# Patient Record
Sex: Female | Born: 1955 | Race: Black or African American | Hispanic: No | Marital: Single | Smoking: Never smoker
Health system: Southern US, Community
[De-identification: ages and names within clinical notes are randomized; demographics above are authoritative.]

## PROBLEM LIST (undated history)

## (undated) DIAGNOSIS — E7800 Pure hypercholesterolemia, unspecified: Secondary | ICD-10-CM

## (undated) DIAGNOSIS — E118 Type 2 diabetes mellitus with unspecified complications: Secondary | ICD-10-CM

## (undated) DIAGNOSIS — I10 Essential (primary) hypertension: Secondary | ICD-10-CM

## (undated) DIAGNOSIS — K219 Gastro-esophageal reflux disease without esophagitis: Secondary | ICD-10-CM

## (undated) HISTORY — PX: CARPAL TUNNEL RELEASE: SHX101

---

## 2010-12-31 ENCOUNTER — Ambulatory Visit: Payer: Self-pay | Admitting: Internal Medicine

## 2012-10-04 LAB — HM PAP SMEAR: HM Pap smear: NEGATIVE

## 2013-01-25 ENCOUNTER — Ambulatory Visit: Payer: Self-pay | Admitting: Family Medicine

## 2013-01-25 LAB — URINALYSIS, COMPLETE
Bilirubin,UR: NEGATIVE
Glucose,UR: NEGATIVE mg/dL (ref 0–75)
Ketone: NEGATIVE
Ph: 6 (ref 4.5–8.0)
Protein: 300
Specific Gravity: >=1.03 (ref 1.003–1.030)

## 2013-01-27 LAB — URINE CULTURE

## 2013-07-01 LAB — HM MAMMOGRAPHY: HM Mammogram: NORMAL

## 2014-06-29 ENCOUNTER — Ambulatory Visit: Payer: Self-pay | Admitting: Internal Medicine

## 2014-06-29 LAB — GC/CHLAMYDIA PROBE AMP

## 2014-06-29 LAB — WET PREP, GENITAL

## 2014-11-03 LAB — LIPID PANEL
CHOLESTEROL: 201 mg/dL — AB (ref 0–200)
HDL: 41 mg/dL (ref 35–70)
LDL Cholesterol: 145 mg/dL
TRIGLYCERIDES: 77 mg/dL (ref 40–160)

## 2014-11-03 LAB — TSH: TSH: 1.02 u[IU]/mL (ref <=5.90)

## 2014-11-03 LAB — CBC AND DIFFERENTIAL: Hemoglobin: 13.1 g/dL (ref 12.0–16.0)

## 2014-11-03 LAB — BASIC METABOLIC PANEL
BUN: 11 mg/dL (ref 4–21)
CREATININE: 0.6 mg/dL (ref <=1.1)

## 2014-12-07 ENCOUNTER — Telehealth: Payer: Self-pay

## 2014-12-07 NOTE — Telephone Encounter (Signed)
-----   Message from Otilio Jefferson sent at 12/04/2014  9:22 AM EDT ----- Regarding: Referrals From: Shary Decamp Sent: 11/22/2014  Please contact pt for Colonoscopy Triage.

## 2014-12-07 NOTE — Telephone Encounter (Signed)
Pt scheduled for a colonoscopy at [facility] on 02-22-15.

## 2014-12-11 ENCOUNTER — Other Ambulatory Visit: Payer: Self-pay

## 2014-12-11 DIAGNOSIS — Z1211 Encounter for screening for malignant neoplasm of colon: Secondary | ICD-10-CM

## 2014-12-11 MED ORDER — NA SULFATE-K SULFATE-MG SULF 17.5-3.13-1.6 GM/177ML PO SOLN: 1.0000 | ORAL | Status: DC

## 2015-04-20 ENCOUNTER — Encounter: Admission: RE | Payer: Self-pay | Source: Ambulatory Visit

## 2015-04-20 ENCOUNTER — Ambulatory Visit
Admission: RE | Admit: 2015-04-20 | Payer: Managed Care, Other (non HMO) | Source: Ambulatory Visit | Admitting: Gastroenterology

## 2015-04-20 SURGERY — COLONOSCOPY
Anesthesia: Choice

## 2015-05-02 ENCOUNTER — Encounter: Payer: Self-pay | Admitting: Internal Medicine

## 2015-05-02 ENCOUNTER — Other Ambulatory Visit: Payer: Self-pay | Admitting: Internal Medicine

## 2015-05-02 DIAGNOSIS — I2510 Atherosclerotic heart disease of native coronary artery without angina pectoris: Secondary | ICD-10-CM | POA: Insufficient documentation

## 2015-05-02 DIAGNOSIS — I10 Essential (primary) hypertension: Secondary | ICD-10-CM | POA: Insufficient documentation

## 2015-05-02 DIAGNOSIS — K219 Gastro-esophageal reflux disease without esophagitis: Secondary | ICD-10-CM | POA: Insufficient documentation

## 2015-11-01 ENCOUNTER — Other Ambulatory Visit: Payer: Self-pay | Admitting: Internal Medicine

## 2015-11-12 NOTE — Telephone Encounter (Signed)
Pt scheduled CPE on 8/29 @ 930 and was added to a cancellation list.

## 2015-11-21 ENCOUNTER — Other Ambulatory Visit: Payer: Self-pay | Admitting: Internal Medicine

## 2016-01-09 ENCOUNTER — Ambulatory Visit
Admission: EM | Admit: 2016-01-09 | Discharge: 2016-01-09 | Disposition: A | Discharge status: Home / Self Care | Payer: Managed Care, Other (non HMO) | Attending: Family Medicine | Admitting: Family Medicine

## 2016-01-09 DIAGNOSIS — J0100 Acute maxillary sinusitis, unspecified: Secondary | ICD-10-CM

## 2016-01-09 DIAGNOSIS — J40 Bronchitis, not specified as acute or chronic: Secondary | ICD-10-CM

## 2016-01-09 MED ORDER — HYDROCOD POLST-CPM POLST ER 10-8 MG/5ML PO SUER: 5.0000 mL | Freq: Every evening | ORAL | Status: DC | PRN

## 2016-01-09 MED ORDER — BENZONATATE 100 MG PO CAPS: 100.0000 mg | ORAL_CAPSULE | Freq: Three times a day (TID) | ORAL | Status: DC | PRN

## 2016-01-09 MED ORDER — DOXYCYCLINE HYCLATE 100 MG PO CAPS: 100.0000 mg | ORAL_CAPSULE | Freq: Two times a day (BID) | ORAL | Status: DC

## 2016-01-09 NOTE — Discharge Instructions (Signed)
Take medication as prescribed. Rest. Drink plenty of fluids.   Follow up with your primary care physician this week as needed. Return to Urgent care for new or worsening concerns.    Sinusitis, Adult Sinusitis is redness, soreness, and puffiness (inflammation) of the air pockets in the bones of your face (sinuses). The redness, soreness, and puffiness can cause air and mucus to get trapped in your sinuses. This can allow germs to grow and cause an infection.  HOME CARE   Drink enough fluids to keep your pee (urine) clear or pale yellow.  Use a humidifier in your home.  Run a hot shower to create steam in the bathroom. Sit in the bathroom with the door closed. Breathe in the steam 3-4 times a day.  Put a warm, moist washcloth on your face 3-4 times a day, or as told by your doctor.  Use salt water sprays (saline sprays) to wet the thick fluid in your nose. This can help the sinuses drain.  Only take medicine as told by your doctor. GET HELP RIGHT AWAY IF:   Your pain gets worse.  You have very bad headaches.  You are sick to your stomach (nauseous).  You throw up (vomit).  You are very sleepy (drowsy) all the time.  Your face is puffy (swollen).  Your vision changes.  You have a stiff neck.  You have trouble breathing. MAKE SURE YOU:   Understand these instructions.  Will watch your condition.  Will get help right away if you are not doing well or get worse.   This information is not intended to replace advice given to you by your health care provider. Make sure you discuss any questions you have with your health care provider.   Document Released: 12/03/2007 Document Revised: 07/07/2014 Document Reviewed: 01/20/2012 Elsevier Interactive Patient Education [employer].

## 2016-01-09 NOTE — ED Notes (Signed)
Patient complains of cough x 10 days. Patient states that she has been having a productive cough with yellow mucus. Patient states that cough is worse at night and when laying down. Patient denies shortness of breath and wheezing.

## 2016-01-09 NOTE — ED Provider Notes (Signed)
[facility]  ____________________________________________  Time seen: Approximately 8:54 AM  I have reviewed the triage vital signs and the nursing notes.   HISTORY  Chief Complaint Cough  HPI Molly Kelly is a 60 y.o. female presents with complaint of 10 days of cough. Patient reports she does have a productive cough that is a thick yellowish whitish mucus. Patient reports cough is worse at night and states that she can feel postnasal drainage. Patient reports that she is having nasal congestion as well, but unable to clear much mucus nasally. Patient states that cough frequently wakes her up at night. Denies any chest pain or shortness of breath. Denies fevers. Reports continues to eat and drink well. Denies recent sickness. Denies recent antibiotic use.  Denies chest pain, shortness breath, abdominal pain, dysuria, neck pain, back pain, extremity pain or extremity swelling.  PCP Army Melia  No LMP recorded. Patient is postmenopausal.   History reviewed. No pertinent past medical history.  Patient Active Problem List   Diagnosis Date Noted  . Arteriosclerosis of coronary artery 05/02/2015  . Acid reflux 05/02/2015  . HLD (hyperlipidemia) 05/02/2015  . Benign hypertension 05/02/2015    Past Surgical History  Procedure Laterality Date  . No past surgeries      Current Outpatient Rx  Name  Route  Sig  Dispense  Refill  . aspirin 81 MG tablet   Oral   Take 1 tablet by mouth daily.         . enalapril (VASOTEC) 10 MG tablet      TAKE 1 TABLET BY MOUTH EVERY DAY   90 tablet   0   . ranitidine (ZANTAC) 300 MG capsule      TAKE ONE CAPSULE BY MOUTH EVERY DAY   90 capsule   1   . simvastatin (ZOCOR) 40 MG tablet      TAKE 1 TABLET BY MOUTH AT BEDTIME   90 tablet   0   . spironolactone (ALDACTONE) 25 MG tablet      TAKE 1 TABLET BY MOUTH EVERY DAY   90 tablet   0   . Na Sulfate-K Sulfate-Mg Sulf (SUPREP BOWEL PREP) SOLN   Oral   Take 1  kit by mouth as directed.   1 Bottle   0     Allergies Review of patient's allergies indicates no known allergies.  Family History  Problem Relation Age of Onset  . Hypertension Mother   . CAD Brother   . Uterine cancer Mother     Social History Social History  Substance Use Topics  . Smoking status: Never Smoker   . Smokeless tobacco: None  . Alcohol Use: 1.2 oz/week    2 Standard drinks or equivalent per week     Comment: occasional    Review of Systems Constitutional: No fever/chills Eyes: No visual changes. ENT: No sore throat. As above Cardiovascular: Denies chest pain. Respiratory: Denies shortness of breath. Gastrointestinal: No abdominal pain.  No nausea, no vomiting.  No diarrhea.  No constipation. Genitourinary: Negative for dysuria. Musculoskeletal: Negative for back pain. Skin: Negative for rash. Neurological: Negative for headaches, focal weakness or numbness.  10-point ROS otherwise negative.  ____________________________________________   PHYSICAL EXAM:  VITAL SIGNS: ED Triage Vitals  Enc Vitals Group     BP 01/09/16 0845 142/93 mmHg     Pulse Rate 01/09/16 0845 79     Resp 01/09/16 0845 17     Temp 01/09/16 0845 98 F (36.7 C)  Temp Source 01/09/16 0845 Oral     SpO2 01/09/16 0845 99 %     Weight 01/09/16 0845 230 lb (104.327 kg)     Height 01/09/16 0845 5' 4"  (1.626 m)     Head Cir --      Peak Flow --      Pain Score 01/09/16 0847 0     Pain Loc --      Pain Edu? --      Excl. in [place]? --     Constitutional: Alert and oriented. Well appearing and in no acute distress. Eyes: Conjunctivae are normal. PERRL. EOMI. Head: Atraumatic.No tenderness to palpation bilateral frontal and maxillary sinuses. No swelling. No erythema.   Ears: no erythema, normal TMs bilaterally.   Nose: nasal congestion with bilateral nasal turbinate erythema and edema.   Mouth/Throat: Mucous membranes are moist.  Oropharynx non-erythematous.No tonsillar  swelling or exudate.  Neck: No stridor.  No cervical spine tenderness to palpation. Hematological/Lymphatic/Immunilogical: No cervical lymphadenopathy. Cardiovascular: Normal rate, regular rhythm. Grossly normal heart sounds.  Good peripheral circulation. Respiratory: Normal respiratory effort.  No retractions. Lungs CTAB. No wheezes, rales or rhonchi. Good air movement. Dry intermittent cough noted. Gastrointestinal: Soft and nontender. No distention. Normal Bowel sounds. No CVA tenderness. Musculoskeletal: No lower or upper extremity tenderness nor edema.  Bilateral pedal pulses equal and easily palpated. No cervical, thoracic or lumbar tenderness to palpation.  Neurologic:  Normal speech and language. No gross focal neurologic deficits are appreciated. No gait instability. Skin:  Skin is warm, dry and intact. No rash noted. Psychiatric: Mood and affect are normal. Speech and behavior are normal.  ____________________________________________   LABS (all labs ordered are listed, but only abnormal results are displayed)  Labs Reviewed - No data to display   INITIAL IMPRESSION / ASSESSMENT AND PLAN / ED COURSE  Pertinent labs & imaging results that were available during my care of the patient were reviewed by me and considered in my medical decision making (see chart for details).  Well-appearing patient. No acute distress. Presents with a complaint to 10 days of cough and chest congestion. Lungs clear throughout. Dry intermittent cough noted in room. Nasal congestion with nasal drainage. Suspect bronchitis and sinusitis. Will treat with oral doxycycline, when necessary Tussionex at night and when necessary Tessalon Perles during the day. Work note for today and tomorrow given. Encouraged rest, fluids and PCP follow-up as needed. Discussed indication, risks and benefits of medications with patient. Discussed photosensitivity with antibiotic.  Discussed follow up with Primary care physician  this week. Discussed follow up and return parameters including no resolution or any worsening concerns. Patient verbalized understanding and agreed to plan.   ____________________________________________   FINAL CLINICAL IMPRESSION(S) / ED DIAGNOSES  Final diagnoses:  Bronchitis  Acute maxillary sinusitis, recurrence not specified     Discharge Medication List as of 01/09/2016  9:15 AM    START taking these medications   Details  benzonatate (TESSALON PERLES) 100 MG capsule Take 1 capsule (100 mg total) by mouth 3 (three) times daily as needed for cough., Starting 01/09/2016, Until Discontinued, Normal    chlorpheniramine-HYDROcodone (TUSSIONEX PENNKINETIC ER) 10-8 MG/5ML SUER Take 5 mLs by mouth at bedtime as needed. do not drive or operate machinery while taking as can cause drowsiness., Starting 01/09/2016, Until Discontinued, Print    doxycycline (VIBRAMYCIN) 100 MG capsule Take 1 capsule (100 mg total) by mouth 2 (two) times daily., Starting 01/09/2016, Until Discontinued, Normal  Note: This dictation was prepared with Dragon dictation along with smaller phrase technology. Any transcriptional errors that result from this process are unintentional.       Marylene Land, NP 01/09/16 0930  Marylene Land, NP 01/09/16 0930

## 2016-01-27 ENCOUNTER — Other Ambulatory Visit: Payer: Self-pay | Admitting: Internal Medicine

## 2016-02-26 ENCOUNTER — Encounter: Payer: Self-pay | Admitting: Internal Medicine

## 2016-02-26 ENCOUNTER — Ambulatory Visit (INDEPENDENT_AMBULATORY_CARE_PROVIDER_SITE_OTHER): Payer: Managed Care, Other (non HMO) | Admitting: Internal Medicine

## 2016-02-26 ENCOUNTER — Other Ambulatory Visit: Payer: Self-pay

## 2016-02-26 ENCOUNTER — Ambulatory Visit
Admission: RE | Admit: 2016-02-26 | Discharge: 2016-02-26 | Disposition: A | Discharge status: Home / Self Care | Payer: Managed Care, Other (non HMO) | Source: Ambulatory Visit | Attending: Internal Medicine | Admitting: Internal Medicine

## 2016-02-26 VITALS — BP 126/84 | HR 85 | Resp 16 | Ht 64.0 in | Wt 230.0 lb

## 2016-02-26 DIAGNOSIS — I10 Essential (primary) hypertension: Secondary | ICD-10-CM

## 2016-02-26 DIAGNOSIS — Z1239 Encounter for other screening for malignant neoplasm of breast: Secondary | ICD-10-CM

## 2016-02-26 DIAGNOSIS — Z1231 Encounter for screening mammogram for malignant neoplasm of breast: Secondary | ICD-10-CM | POA: Diagnosis present

## 2016-02-26 DIAGNOSIS — K219 Gastro-esophageal reflux disease without esophagitis: Secondary | ICD-10-CM

## 2016-02-26 DIAGNOSIS — Z0000 Encounter for general adult medical examination without abnormal findings: Secondary | ICD-10-CM

## 2016-02-26 DIAGNOSIS — E785 Hyperlipidemia, unspecified: Secondary | ICD-10-CM | POA: Diagnosis not present

## 2016-02-26 DIAGNOSIS — Z1159 Encounter for screening for other viral diseases: Secondary | ICD-10-CM

## 2016-02-26 DIAGNOSIS — Z124 Encounter for screening for malignant neoplasm of cervix: Secondary | ICD-10-CM

## 2016-02-26 LAB — POCT URINALYSIS DIPSTICK
BILIRUBIN UA: NEGATIVE
Blood, UA: NEGATIVE
Glucose, UA: NEGATIVE
KETONES UA: NEGATIVE
LEUKOCYTES UA: NEGATIVE
Nitrite, UA: NEGATIVE
Protein, UA: NEGATIVE
Spec Grav, UA: 1.01
pH, UA: 6

## 2016-02-26 MED ORDER — ENALAPRIL MALEATE 10 MG PO TABS: 10.0000 mg | ORAL_TABLET | Freq: Every day | ORAL | 3 refills | Status: DC

## 2016-02-26 MED ORDER — RANITIDINE HCL 300 MG PO CAPS: 300.0000 mg | ORAL_CAPSULE | Freq: Every day | ORAL | 3 refills | Status: DC

## 2016-02-26 MED ORDER — SIMVASTATIN 40 MG PO TABS: 40.0000 mg | ORAL_TABLET | Freq: Every day | ORAL | 3 refills | Status: DC

## 2016-02-26 MED ORDER — SPIRONOLACTONE 25 MG PO TABS: 25.0000 mg | ORAL_TABLET | Freq: Every day | ORAL | 3 refills | Status: DC

## 2016-02-26 NOTE — Patient Instructions (Signed)
Breast Self-Awareness Practicing breast self-awareness may pick up problems early, prevent significant medical complications, and possibly save your life. By practicing breast self-awareness, you can become familiar with how your breasts look and feel and if your breasts are changing. This allows you to notice changes early. It can also offer you some reassurance that your breast health is good. One way to learn what is normal for your breasts and whether your breasts are changing is to do a breast self-exam. If you find a lump or something that was not present in the past, it is best to contact your caregiver right away. Other findings that should be evaluated by your caregiver include nipple discharge, especially if it is bloody; skin changes or reddening; areas where the skin seems to be pulled in (retracted); or new lumps and bumps. Breast pain is seldom associated with cancer (malignancy), but should also be evaluated by a caregiver. HOW TO PERFORM A BREAST SELF-EXAM The best time to examine your breasts is 5-7 days after your menstrual period is over. During menstruation, the breasts are lumpier, and it may be more difficult to pick up changes. If you do not menstruate, have reached menopause, or had your uterus removed (hysterectomy), you should examine your breasts at regular intervals, such as monthly. If you are breastfeeding, examine your breasts after a feeding or after using a breast pump. Breast implants do not decrease the risk for lumps or tumors, so continue to perform breast self-exams as recommended. Talk to your caregiver about how to determine the difference between the implant and breast tissue. Also, talk about the amount of pressure you should use during the exam. Over time, you will become more familiar with the variations of your breasts and more comfortable with the exam. A breast self-exam requires you to remove all your clothes above the waist. 1. Look at your breasts and nipples.  Stand in front of a mirror in a room with good lighting. With your hands on your hips, push your hands firmly downward. Look for a difference in shape, contour, and size from one breast to the other (asymmetry). Asymmetry includes puckers, dips, or bumps. Also, look for skin changes, such as reddened or scaly areas on the breasts. Look for nipple changes, such as discharge, dimpling, repositioning, or redness. 2. Carefully feel your breasts. This is best done either in the shower or tub while using soapy water or when flat on your back. Place the arm (on the side of the breast you are examining) above your head. Use the pads (not the fingertips) of your three middle fingers on your opposite hand to feel your breasts. Start in the underarm area and use  inch (2 cm) overlapping circles to feel your breast. Use 3 different levels of pressure (light, medium, and firm pressure) at each circle before moving to the next circle. The light pressure is needed to feel the tissue closest to the skin. The medium pressure will help to feel breast tissue a little deeper, while the firm pressure is needed to feel the tissue close to the ribs. Continue the overlapping circles, moving downward over the breast until you feel your ribs below your breast. Then, move one finger-width towards the center of the body. Continue to use the  inch (2 cm) overlapping circles to feel your breast as you move slowly up toward the collar bone (clavicle) near the base of the neck. Continue the up and down exam using all 3 pressures until you reach the   middle of the chest. Do this with each breast, carefully feeling for lumps or changes. 3.  Keep a written record with breast changes or normal findings for each breast. By writing this information down, you do not need to depend only on memory for size, tenderness, or location. Write down where you are in your menstrual cycle, if you are still menstruating. Breast tissue can have some lumps or  thick tissue. However, see your caregiver if you find anything that concerns you.  SEEK MEDICAL CARE IF:  You see a change in shape, contour, or size of your breasts or nipples.   You see skin changes, such as reddened or scaly areas on the breasts or nipples.   You have an unusual discharge from your nipples.   You feel a new lump or unusually thick areas.    This information is not intended to replace advice given to you by your health care provider. Make sure you discuss any questions you have with your health care provider.   Document Released: 06/16/2005 Document Revised: 06/02/2012 Document Reviewed: 10/01/2011 Elsevier Interactive Patient Education 2016 Elsevier Inc.  

## 2016-02-26 NOTE — Progress Notes (Signed)
Date:  02/26/2016   Name:  Molly Kelly   DOB:  [birth year]   MRN:  [id number]   Chief Complaint: Annual Exam (PAP-need colonoscopy resch and mammo) Molly Kelly is a 60 y.o. female who presents today for her Complete Annual Exam. She feels well. She reports exercising walking some. 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 She denies breast problems - due for mammogram.  She is due for Pap smear as well.  Hypertension  This is a chronic problem. The current episode started more than 1 year ago. The problem is unchanged. The problem is resistant. Pertinent negatives include no chest pain, headaches, palpitations or shortness of breath. Past treatments include ACE inhibitors and diuretics. The current treatment provides significant improvement. There are no compliance problems.   Hyperlipidemia  This is a chronic problem. The current episode started more than 1 year ago. The problem is controlled. Pertinent negatives include no chest pain or shortness of breath. Current antihyperlipidemic treatment includes statins. The current treatment provides significant improvement of lipids.   BP Readings from Last 3 Encounters:  02/26/16 126/84  01/09/16 (!) 142/93    Review of Systems  Constitutional: Negative for chills, fatigue and fever.  HENT: Negative for congestion, hearing loss, tinnitus, trouble swallowing and voice change.   Eyes: Negative for visual disturbance.  Respiratory: Negative for cough, chest tightness, shortness of breath and wheezing.   Cardiovascular: Negative for chest pain, palpitations and leg swelling.  Gastrointestinal: Positive for constipation. Negative for abdominal pain, blood in stool, diarrhea and vomiting.  Endocrine: Negative for polydipsia and polyuria.  Genitourinary: Negative for dysuria, frequency, genital sores, vaginal bleeding and vaginal discharge.  Musculoskeletal: Positive for arthralgias. Negative for gait problem  and joint swelling.  Skin: Negative for color change and rash.  Neurological: Negative for dizziness, tremors, light-headedness and headaches.  Hematological: Negative for adenopathy. Does not bruise/bleed easily.  Psychiatric/Behavioral: Positive for sleep disturbance. Negative for dysphoric mood. The patient is not nervous/anxious.     Patient Active Problem List   Diagnosis Date Noted  . Arteriosclerosis of coronary artery 05/02/2015  . Acid reflux 05/02/2015  . HLD (hyperlipidemia) 05/02/2015  . Benign hypertension 05/02/2015    Prior to Admission medications   Medication Sig Start Date End Date Taking? Authorizing Provider  aspirin 81 MG tablet Take 1 tablet by mouth daily.   Yes Historical Provider, MD  enalapril (VASOTEC) 10 MG tablet TAKE 1 TABLET BY MOUTH EVERY DAY 01/28/16  Yes Glean Hess, MD  ranitidine (ZANTAC) 300 MG capsule TAKE ONE CAPSULE BY MOUTH EVERY DAY 11/21/15  Yes Glean Hess, MD  simvastatin (ZOCOR) 40 MG tablet TAKE 1 TABLET BY MOUTH AT BEDTIME 01/28/16  Yes Glean Hess, MD  spironolactone (ALDACTONE) 25 MG tablet TAKE 1 TABLET BY MOUTH EVERY DAY 01/28/16  Yes Glean Hess, MD    No Known Allergies  Past Surgical History:  Procedure Laterality Date  . NO PAST SURGERIES      Social History  Substance Use Topics  . Smoking status: Never Smoker  . Smokeless tobacco: Never Used  . Alcohol use 1.2 oz/week    2 Standard drinks or equivalent per week     Comment: occasional     Medication list has been reviewed and updated.   Physical Exam  Constitutional: She is oriented to person, place, and time. She appears well-developed and well-nourished. No distress.  HENT:  Head: Normocephalic and atraumatic.  Right  Ear: Tympanic membrane and ear canal normal.  Left Ear: Tympanic membrane and ear canal normal.  Nose: Right sinus exhibits no maxillary sinus tenderness. Left sinus exhibits no maxillary sinus tenderness.  Mouth/Throat: Uvula  is midline and oropharynx is clear and moist.  Eyes: Conjunctivae and EOM are normal. Right eye exhibits no discharge. Left eye exhibits no discharge. No scleral icterus.  Neck: Normal range of motion. Carotid bruit is not present. No erythema present. No thyromegaly present.  Cardiovascular: Normal rate, regular rhythm, normal heart sounds and normal pulses.   Pulmonary/Chest: Effort normal. No respiratory distress. She has no wheezes. Right breast exhibits no mass, no nipple discharge, no skin change and no tenderness. Left breast exhibits no mass, no nipple discharge, no skin change and no tenderness.  Abdominal: Soft. Bowel sounds are normal. There is no hepatosplenomegaly. There is no tenderness. There is no CVA tenderness.  Genitourinary: Vagina normal and uterus normal. There is no tenderness, lesion or injury on the right labia. There is no tenderness, lesion or injury on the left labia. Cervix exhibits no motion tenderness, no discharge and no friability. Right adnexum displays no mass, no tenderness and no fullness. Left adnexum displays no mass, no tenderness and no fullness.  Musculoskeletal: Normal range of motion.  Lymphadenopathy:    She has no cervical adenopathy.    She has no axillary adenopathy.  Neurological: She is alert and oriented to person, place, and time. She has normal reflexes. No cranial nerve deficit or sensory deficit.  Skin: Skin is warm, dry and intact. No rash noted.  Psychiatric: She has a normal mood and affect. Her speech is normal and behavior is normal. Thought content normal.  Nursing note and vitals reviewed.   BP 126/84 (BP Location: Right Arm, Patient Position: Sitting, Cuff Size: Large)   Pulse 85   Resp 16   Ht 5\' 4"  (1.626 m)   Wt 230 lb (104.3 kg)   SpO2 100%   BMI 39.48 kg/m   Assessment and Plan: 1. Annual physical exam Reschedule Colonoscopy - POCT urinalysis dipstick - Ambulatory referral to Gastroenterology  2. Benign  hypertension Continue medications - - Comprehensive metabolic panel - TSH - enalapril (VASOTEC) 10 MG tablet; Take 1 tablet (10 mg total) by mouth daily.  Dispense: 90 tablet; Refill: 3 - spironolactone (ALDACTONE) 25 MG tablet; Take 1 tablet (25 mg total) by mouth daily.  Dispense: 90 tablet; Refill: 3  3. Gastroesophageal reflux disease, esophagitis presence not specified controlled - CBC with Differential/Platelet - ranitidine (ZANTAC) 300 MG capsule; Take 1 capsule (300 mg total) by mouth daily.  Dispense: 90 capsule; Refill: 3  4. HLD (hyperlipidemia) On statin therapy - Lipid panel - simvastatin (ZOCOR) 40 MG tablet; Take 1 tablet (40 mg total) by mouth at bedtime.  Dispense: 90 tablet; Refill: 3  5. Breast cancer screening - MM DIGITAL SCREENING BILATERAL; Future  6. Encounter for screening for cervical cancer  - Pap IG and HPV (high risk) DNA detection  7. Need for hepatitis C screening test - Hepatitis C antibody   Halina Maidens, MD [facility]  02/26/2016

## 2016-02-27 ENCOUNTER — Telehealth: Payer: Self-pay

## 2016-02-27 ENCOUNTER — Other Ambulatory Visit: Payer: Self-pay

## 2016-02-27 LAB — CBC WITH DIFFERENTIAL/PLATELET
BASOS ABS: 0 10*3/uL (ref 0.0–0.2)
Basos: 1 %
EOS (ABSOLUTE): 0.1 10*3/uL (ref 0.0–0.4)
Eos: 2 %
Hematocrit: 39.5 % (ref 34.0–46.6)
Hemoglobin: 13.1 g/dL (ref 11.1–15.9)
IMMATURE GRANS (ABS): 0 10*3/uL (ref 0.0–0.1)
IMMATURE GRANULOCYTES: 0 %
LYMPHS: 51 %
Lymphocytes Absolute: 2.6 10*3/uL (ref 0.7–3.1)
MCH: 31.3 pg (ref 26.6–33.0)
MCHC: 33.2 g/dL (ref 31.5–35.7)
MCV: 95 fL (ref 79–97)
MONOCYTES: 10 %
Monocytes Absolute: 0.5 10*3/uL (ref 0.1–0.9)
NEUTROS ABS: 1.8 10*3/uL (ref 1.4–7.0)
NEUTROS PCT: 36 %
PLATELETS: 292 10*3/uL (ref 150–379)
RBC: 4.18 x10E6/uL (ref 3.77–5.28)
RDW: 13.3 % (ref 12.3–15.4)
WBC: 5.1 10*3/uL (ref 3.4–10.8)

## 2016-02-27 LAB — COMPREHENSIVE METABOLIC PANEL
ALBUMIN: 4.7 g/dL (ref 3.5–5.5)
ALT: 16 IU/L (ref 0–32)
AST: 18 IU/L (ref 0–40)
Albumin/Globulin Ratio: 1.6 (ref 1.2–2.2)
Alkaline Phosphatase: 59 IU/L (ref 39–117)
BUN / CREAT RATIO: 15 (ref 9–23)
BUN: 11 mg/dL (ref 6–24)
Bilirubin Total: 0.4 mg/dL (ref 0.0–1.2)
CO2: 25 mmol/L (ref 18–29)
CREATININE: 0.75 mg/dL (ref 0.57–1.00)
Calcium: 10.4 mg/dL — ABNORMAL HIGH (ref 8.7–10.2)
Chloride: 100 mmol/L (ref 96–106)
GFR calc non Af Amer: 88 mL/min/{1.73_m2} (ref >59)
GFR, EST AFRICAN AMERICAN: 102 mL/min/{1.73_m2} (ref >59)
GLOBULIN, TOTAL: 3 g/dL (ref 1.5–4.5)
GLUCOSE: 96 mg/dL (ref 65–99)
Potassium: 4.5 mmol/L (ref 3.5–5.2)
Sodium: 139 mmol/L (ref 134–144)
TOTAL PROTEIN: 7.7 g/dL (ref 6.0–8.5)

## 2016-02-27 LAB — LIPID PANEL
CHOL/HDL RATIO: 5.1 ratio — AB (ref 0.0–4.4)
Cholesterol, Total: 205 mg/dL — ABNORMAL HIGH (ref 100–199)
HDL: 40 mg/dL (ref >39)
LDL CALC: 149 mg/dL — AB (ref 0–99)
Triglycerides: 79 mg/dL (ref 0–149)
VLDL CHOLESTEROL CAL: 16 mg/dL (ref 5–40)

## 2016-02-27 LAB — TSH: TSH: 1.63 u[IU]/mL (ref 0.450–4.500)

## 2016-02-27 LAB — HEPATITIS C ANTIBODY

## 2016-02-27 NOTE — Telephone Encounter (Signed)
Patient needs to be scheduled for a colonoscopy.

## 2016-02-28 LAB — PAP IG AND HPV HIGH-RISK
HPV, high-risk: NEGATIVE
PAP SMEAR COMMENT: 0

## 2016-03-11 NOTE — Telephone Encounter (Signed)
Patient needs to be scheduled for a colonoscopy. Please attach the appointment to the referral once the appointment has been scheduled. Notification letter has been sent to the patient.

## 2016-04-24 ENCOUNTER — Telehealth: Payer: Self-pay

## 2016-04-24 ENCOUNTER — Other Ambulatory Visit: Payer: Self-pay

## 2016-04-24 NOTE — Telephone Encounter (Signed)
Gastroenterology Pre-Procedure Review  Request Date: 05/30/2016 Requesting Physician: Dr. Army Melia  PATIENT REVIEW QUESTIONS: The patient responded to the following health history questions as indicated:    1. Are you having any GI issues? no 2. Do you have a personal history of Polyps? no 3. Do you have a family history of Colon Cancer or Polyps? no 4. Diabetes Mellitus? no 5. Joint replacements in the past 12 months?no 6. Major health problems in the past 3 months?no 7. Any artificial heart valves, MVP, or defibrillator?no    MEDICATIONS & ALLERGIES:    Patient reports the following regarding taking any anticoagulation/antiplatelet therapy:   Plavix, Coumadin, Eliquis, Xarelto, Lovenox, Pradaxa, Brilinta, or Effient? no Aspirin? yes (Heart Health )  Patient confirms/reports the following medications:  Current Outpatient Prescriptions  Medication Sig Dispense Refill  . aspirin 81 MG tablet Take 1 tablet by mouth daily.    . enalapril (VASOTEC) 10 MG tablet Take 1 tablet (10 mg total) by mouth daily. 90 tablet 3  . ranitidine (ZANTAC) 300 MG capsule Take 1 capsule (300 mg total) by mouth daily. 90 capsule 3  . simvastatin (ZOCOR) 40 MG tablet Take 1 tablet (40 mg total) by mouth at bedtime. 90 tablet 3  . spironolactone (ALDACTONE) 25 MG tablet Take 1 tablet (25 mg total) by mouth daily. 90 tablet 3   No current facility-administered medications for this visit.     Patient confirms/reports the following allergies:  No Known Allergies  No orders of the defined types were placed in this encounter.   AUTHORIZATION INFORMATION Primary Insurance: 1D#: Group #:  Secondary Insurance: 1D#: Group #:  SCHEDULE INFORMATION: Date: 05/30/2016 Time: Location: [facility]

## 2016-04-24 NOTE — Telephone Encounter (Signed)
Screening Colonoscopy Z12.11 [facility] 05/30/2016 Dr. Demetrios Loll Pre cert is not required

## 2016-05-26 ENCOUNTER — Telehealth: Payer: Self-pay | Admitting: Gastroenterology

## 2016-05-26 NOTE — Telephone Encounter (Signed)
Spoke with pt and she will call me back to reschedule when she has her work schedule.

## 2016-05-26 NOTE — Telephone Encounter (Signed)
Patient needs to cancel colonoscopy 10-09-2022 due to a death in her family. Please call to reschedule.

## 2016-05-30 ENCOUNTER — Ambulatory Visit: Admit: 2016-05-30 | Payer: Managed Care, Other (non HMO) | Admitting: Gastroenterology

## 2016-05-30 SURGERY — COLONOSCOPY WITH PROPOFOL
Anesthesia: General

## 2016-08-27 ENCOUNTER — Ambulatory Visit: Payer: Managed Care, Other (non HMO) | Admitting: Internal Medicine

## 2016-08-28 ENCOUNTER — Ambulatory Visit (INDEPENDENT_AMBULATORY_CARE_PROVIDER_SITE_OTHER): Payer: Managed Care, Other (non HMO) | Admitting: Internal Medicine

## 2016-08-28 ENCOUNTER — Encounter: Payer: Self-pay | Admitting: Internal Medicine

## 2016-08-28 VITALS — BP 124/86 | HR 74 | Ht 65.0 in | Wt 233.0 lb

## 2016-08-28 DIAGNOSIS — K219 Gastro-esophageal reflux disease without esophagitis: Secondary | ICD-10-CM

## 2016-08-28 DIAGNOSIS — I10 Essential (primary) hypertension: Secondary | ICD-10-CM

## 2016-08-28 DIAGNOSIS — E782 Mixed hyperlipidemia: Secondary | ICD-10-CM | POA: Diagnosis not present

## 2016-08-28 NOTE — Progress Notes (Signed)
Date:  08/28/2016   Name:  Molly Kelly   DOB:  April 26, 1956   MRN:  [id number]   Chief Complaint: Hypertension Hypertension  This is a chronic problem. The current episode started more than 1 year ago. The problem is unchanged. The problem is controlled. Pertinent negatives include no chest pain, headaches, palpitations or shortness of breath. Past treatments include ACE inhibitors and diuretics. The current treatment provides significant improvement.  Hyperlipidemia  This is a chronic problem. Pertinent negatives include no chest pain or shortness of breath. Current antihyperlipidemic treatment includes statins. The current treatment provides moderate improvement of lipids.  Gastroesophageal Reflux  She complains of heartburn (occasionally). She reports no abdominal pain, no chest pain or no coughing. This is a recurrent problem. The problem occurs occasionally. The symptoms are aggravated by lying down and certain foods. Pertinent negatives include no fatigue. She has tried a histamine-2 antagonist for the symptoms. The treatment provided significant relief.  Initial BP here was high but improved after 10 minute rest. [facility] was excellent.  She did get braces yesterday and ate canned soup for dinner which may be contributing to elevated BP today. In general, she is limiting her sodium intake.  Lab Results  Component Value Date   CHOL 205 (H) 02/26/2016   HDL 40 02/26/2016   LDLCALC 149 (H) 02/26/2016   TRIG 79 02/26/2016   CHOLHDL 5.1 (H) 02/26/2016     Review of Systems  Constitutional: Negative for appetite change, fatigue, fever and unexpected weight change.  HENT: Negative for tinnitus and trouble swallowing.   Eyes: Negative for visual disturbance.  Respiratory: Negative for cough, chest tightness and shortness of breath.   Cardiovascular: Negative for chest pain, palpitations and leg swelling.  Gastrointestinal: Positive for heartburn  (occasionally). Negative for abdominal pain.  Genitourinary: Negative for dysuria and hematuria.  Musculoskeletal: Negative for arthralgias.  Neurological: Negative for tremors, numbness and headaches.  Psychiatric/Behavioral: Negative for dysphoric mood.    Patient Active Problem List   Diagnosis Date Noted  . Arteriosclerosis of coronary artery 05/02/2015  . Gastroesophageal reflux disease 05/02/2015  . HLD (hyperlipidemia) 05/02/2015  . Benign hypertension 05/02/2015    Prior to Admission medications   Medication Sig Start Date End Date Taking? Authorizing Provider  aspirin 81 MG tablet Take 1 tablet by mouth daily.    Historical Provider, MD  enalapril (VASOTEC) 10 MG tablet Take 1 tablet (10 mg total) by mouth daily. 02/26/16   Glean Hess, MD  ranitidine (ZANTAC) 300 MG capsule Take 1 capsule (300 mg total) by mouth daily. 02/26/16   Glean Hess, MD  simvastatin (ZOCOR) 40 MG tablet Take 1 tablet (40 mg total) by mouth at bedtime. 02/26/16   Glean Hess, MD  spironolactone (ALDACTONE) 25 MG tablet Take 1 tablet (25 mg total) by mouth daily. 02/26/16   Glean Hess, MD    No Known Allergies  Past Surgical History:  Procedure Laterality Date  . NO PAST SURGERIES      Social History  Substance Use Topics  . Smoking status: Never Smoker  . Smokeless tobacco: Never Used  . Alcohol use 1.2 oz/week    2 Standard drinks or equivalent per week     Comment: occasional     Medication list has been reviewed and updated.   Physical Exam  Constitutional: She is oriented to person, place, and time. She appears well-developed. No distress.  HENT:  Head: Normocephalic  and atraumatic.  Neck: Normal range of motion. No thyromegaly present.  Cardiovascular: Normal rate, regular rhythm and normal heart sounds.   Pulmonary/Chest: Effort normal. No respiratory distress.  Musculoskeletal: Normal range of motion. She exhibits no edema or tenderness.  Neurological: She  is alert and oriented to person, place, and time.  Skin: Skin is warm and dry. No rash noted.  Psychiatric: She has a normal mood and affect. Her behavior is normal. Thought content normal.  Nursing note and vitals reviewed.   BP 124/86   Pulse 74   Ht 5\' 5"  (1.651 m)   Wt 233 lb (105.7 kg)   SpO2 100%   BMI 38.77 kg/m   Assessment and Plan: 1. Benign hypertension Controlled - continue low salt diet (DASH) Monitor BP at home or work intermittently  2. Gastroesophageal reflux disease, esophagitis presence not specified Stable   3. Mixed hyperlipidemia On statin therapy   No orders of the defined types were placed in this encounter.   Halina Maidens, MD [facility]  08/28/2016

## 2016-08-28 NOTE — Patient Instructions (Signed)
DASH Eating Plan DASH stands for "Dietary Approaches to Stop Hypertension." The DASH eating plan is a healthy eating plan that has been shown to reduce high blood pressure (hypertension). It may also reduce your risk for type 2 diabetes, heart disease, and stroke. The DASH eating plan may also help with weight loss. What are tips for following this plan? General guidelines  Avoid eating more than 2,300 mg (milligrams) of salt (sodium) a day. If you have hypertension, you may need to reduce your sodium intake to 1,500 mg a day.  Limit alcohol intake to no more than 1 drink a day for nonpregnant women and 2 drinks a day for men. One drink equals 12 oz of beer, 5 oz of wine, or 1 oz of hard liquor.  Work with your health care provider to maintain a healthy body weight or to lose weight. Ask what an ideal weight is for you.  Get at least 30 minutes of exercise that causes your heart to beat faster (aerobic exercise) most days of the week. Activities may include walking, swimming, or biking.  Work with your health care provider or diet and nutrition specialist (dietitian) to adjust your eating plan to your individual calorie needs. Reading food labels  Check food labels for the amount of sodium per serving. Choose foods with less than 5 percent of the Daily Value of sodium. Generally, foods with less than 300 mg of sodium per serving fit into this eating plan.  To find whole grains, look for the word "whole" as the first word in the ingredient list. Shopping  Buy products labeled as "low-sodium" or "no salt added."  Buy fresh foods. Avoid canned foods and premade or frozen meals. Cooking  Avoid adding salt when cooking. Use salt-free seasonings or herbs instead of table salt or sea salt. Check with your health care provider or pharmacist before using salt substitutes.  Do not fry foods. Cook foods using healthy methods such as baking, boiling, grilling, and broiling instead.  Cook with  heart-healthy oils, such as olive, canola, soybean, or sunflower oil. Meal planning   Eat a balanced diet that includes: ? 5 or more servings of fruits and vegetables each day. At each meal, try to fill half of your plate with fruits and vegetables. ? Up to 6-8 servings of whole grains each day. ? Less than 6 oz of lean meat, poultry, or fish each day. A 3-oz serving of meat is about the same size as a deck of cards. One egg equals 1 oz. ? 2 servings of low-fat dairy each day. ? A serving of nuts, seeds, or beans 5 times each week. ? Heart-healthy fats. Healthy fats called Omega-3 fatty acids are found in foods such as flaxseeds and coldwater fish, like sardines, salmon, and mackerel.  Limit how much you eat of the following: ? Canned or prepackaged foods. ? Food that is high in trans fat, such as fried foods. ? Food that is high in saturated fat, such as fatty meat. ? Sweets, desserts, sugary drinks, and other foods with added sugar. ? Full-fat dairy products.  Do not salt foods before eating.  Try to eat at least 2 vegetarian meals each week.  Eat more home-cooked food and less restaurant, buffet, and fast food.  When eating at a restaurant, ask that your food be prepared with less salt or no salt, if possible. What foods are recommended? The items listed may not be a complete list. Talk with your dietitian about what   dietary choices are best for you. Grains Whole-grain or whole-wheat bread. Whole-grain or whole-wheat pasta. Brown rice. Oatmeal. Quinoa. Bulgur. Whole-grain and low-sodium cereals. Pita bread. Low-fat, low-sodium crackers. Whole-wheat flour tortillas. Vegetables Fresh or frozen vegetables (raw, steamed, roasted, or grilled). Low-sodium or reduced-sodium tomato and vegetable juice. Low-sodium or reduced-sodium tomato sauce and tomato paste. Low-sodium or reduced-sodium canned vegetables. Fruits All fresh, dried, or frozen fruit. Canned fruit in natural juice (without  added sugar). Meat and other protein foods Skinless chicken or turkey. Ground chicken or turkey. Pork with fat trimmed off. Fish and seafood. Egg whites. Dried beans, peas, or lentils. Unsalted nuts, nut butters, and seeds. Unsalted canned beans. Lean cuts of beef with fat trimmed off. Low-sodium, lean deli meat. Dairy Low-fat (1%) or fat-free (skim) milk. Fat-free, low-fat, or reduced-fat cheeses. Nonfat, low-sodium ricotta or cottage cheese. Low-fat or nonfat yogurt. Low-fat, low-sodium cheese. Fats and oils Soft margarine without trans fats. Vegetable oil. Low-fat, reduced-fat, or light mayonnaise and salad dressings (reduced-sodium). Canola, safflower, olive, soybean, and sunflower oils. Avocado. Seasoning and other foods Herbs. Spices. Seasoning mixes without salt. Unsalted popcorn and pretzels. Fat-free sweets. What foods are not recommended? The items listed may not be a complete list. Talk with your dietitian about what dietary choices are best for you. Grains Baked goods made with fat, such as croissants, muffins, or some breads. Dry pasta or rice meal packs. Vegetables Creamed or fried vegetables. Vegetables in a cheese sauce. Regular canned vegetables (not low-sodium or reduced-sodium). Regular canned tomato sauce and paste (not low-sodium or reduced-sodium). Regular tomato and vegetable juice (not low-sodium or reduced-sodium). Pickles. Olives. Fruits Canned fruit in a light or heavy syrup. Fried fruit. Fruit in cream or butter sauce. Meat and other protein foods Fatty cuts of meat. Ribs. Fried meat. Bacon. Sausage. Bologna and other processed lunch meats. Salami. Fatback. Hotdogs. Bratwurst. Salted nuts and seeds. Canned beans with added salt. Canned or smoked fish. Whole eggs or egg yolks. Chicken or turkey with skin. Dairy Whole or 2% milk, cream, and half-and-half. Whole or full-fat cream cheese. Whole-fat or sweetened yogurt. Full-fat cheese. Nondairy creamers. Whipped toppings.  Processed cheese and cheese spreads. Fats and oils Butter. Stick margarine. Lard. Shortening. Ghee. Bacon fat. Tropical oils, such as coconut, palm kernel, or palm oil. Seasoning and other foods Salted popcorn and pretzels. Onion salt, garlic salt, seasoned salt, table salt, and sea salt. Worcestershire sauce. Tartar sauce. Barbecue sauce. Teriyaki sauce. Soy sauce, including reduced-sodium. Steak sauce. Canned and packaged gravies. Fish sauce. Oyster sauce. Cocktail sauce. Horseradish that you find on the shelf. Ketchup. Mustard. Meat flavorings and tenderizers. Bouillon cubes. Hot sauce and Tabasco sauce. Premade or packaged marinades. Premade or packaged taco seasonings. Relishes. Regular salad dressings. Where to find more information:  National Heart, Lung, and Blood Institute: www.nhlbi.nih.gov  American Heart Association: www.heart.org Summary  The DASH eating plan is a healthy eating plan that has been shown to reduce high blood pressure (hypertension). It may also reduce your risk for type 2 diabetes, heart disease, and stroke.  With the DASH eating plan, you should limit salt (sodium) intake to 2,300 mg a day. If you have hypertension, you may need to reduce your sodium intake to 1,500 mg a day.  When on the DASH eating plan, aim to eat more fresh fruits and vegetables, whole grains, lean proteins, low-fat dairy, and heart-healthy fats.  Work with your health care provider or diet and nutrition specialist (dietitian) to adjust your eating plan to your individual   calorie needs. This information is not intended to replace advice given to you by your health care provider. Make sure you discuss any questions you have with your health care provider. Document Released: 06/05/2011 Document Revised: 06/09/2016 Document Reviewed: 06/09/2016 Elsevier Interactive Patient Education  2017 Elsevier Inc.  

## 2016-09-11 ENCOUNTER — Ambulatory Visit
Admission: EM | Admit: 2016-09-11 | Discharge: 2016-09-11 | Disposition: A | Discharge status: Home / Self Care | Payer: 59 | Attending: Family Medicine | Admitting: Family Medicine

## 2016-09-11 ENCOUNTER — Encounter: Payer: Self-pay | Admitting: *Deleted

## 2016-09-11 DIAGNOSIS — J0100 Acute maxillary sinusitis, unspecified: Secondary | ICD-10-CM

## 2016-09-11 DIAGNOSIS — R05 Cough: Secondary | ICD-10-CM

## 2016-09-11 DIAGNOSIS — R059 Cough, unspecified: Secondary | ICD-10-CM

## 2016-09-11 HISTORY — DX: Essential (primary) hypertension: I10

## 2016-09-11 HISTORY — DX: Gastro-esophageal reflux disease without esophagitis: K21.9

## 2016-09-11 HISTORY — DX: Pure hypercholesterolemia, unspecified: E78.00

## 2016-09-11 MED ORDER — DOXYCYCLINE HYCLATE 100 MG PO CAPS: 100.0000 mg | ORAL_CAPSULE | Freq: Two times a day (BID) | ORAL | 0 refills | Status: DC

## 2016-09-11 MED ORDER — PREDNISONE 20 MG PO TABS
40.0000 mg | ORAL_TABLET | Freq: Every day | ORAL | 0 refills | Status: DC
Start: 2016-09-11 — End: 2017-04-10

## 2016-09-11 MED ORDER — ALBUTEROL SULFATE HFA 108 (90 BASE) MCG/ACT IN AERS: 2.0000 | INHALATION_SPRAY | Freq: Four times a day (QID) | RESPIRATORY_TRACT | 0 refills | Status: DC | PRN

## 2016-09-11 MED ORDER — BENZONATATE 100 MG PO CAPS: 100.0000 mg | ORAL_CAPSULE | Freq: Three times a day (TID) | ORAL | 0 refills | Status: DC | PRN

## 2016-09-11 NOTE — ED Triage Notes (Signed)
Patient started having symptoms of cough, fever, nasal congestion, and chest congestion 1 week ago.

## 2016-09-11 NOTE — ED Provider Notes (Signed)
[facility] ____________________________________________  Time seen: Approximately 1:56 PM  I have reviewed the triage vital signs and the nursing notes.   HISTORY  Chief Complaint Nasal Congestion; Cough; and Fever   HPI Molly Kelly is a 61 y.o. female presents for complaints of one week of runny nose, nasal congestion, cough and chest congestion. Reports she has been having sinus pressure and sinus discomfort. Reports some thick greenish nasal drainage as well as post nasal drainage.  Reports unresolved with robitussin and coricidin over the counter. Denies known fevers, but reports some chills and hot feelings. Reports sick contacts at work. Denies home sick contacts. Reports has continued to remain active. Reports continues to eat and drink well. States mild sinus pain around cheeks at this time, denies other pain.   Denies chest pain, shortness of breath, abdominal pain, dysuria, extremity pain, extremity swelling or rash. Denies recent sickness. Denies recent antibiotic use.   Halina Maidens, MD: PCP    Past Medical History:  Diagnosis Date  . Acid reflux   . Hypercholesteremia   . Hypertension     Patient Active Problem List   Diagnosis Date Noted  . Arteriosclerosis of coronary artery 05/02/2015  . Gastroesophageal reflux disease 05/02/2015  . HLD (hyperlipidemia) 05/02/2015  . Benign hypertension 05/02/2015    Past Surgical History:  Procedure Laterality Date  . NO PAST SURGERIES       No current facility-administered medications for this encounter.   Current Outpatient Prescriptions:  .  aspirin 81 MG tablet, Take 1 tablet by mouth daily., Disp: , Rfl:  .  enalapril (VASOTEC) 10 MG tablet, Take 1 tablet (10 mg total) by mouth daily., Disp: 90 tablet, Rfl: 3 .  ranitidine (ZANTAC) 300 MG capsule, Take 1 capsule (300 mg total) by mouth daily., Disp: 90 capsule, Rfl: 3 .  simvastatin (ZOCOR) 40 MG tablet, Take 1 tablet (40 mg total) by mouth  at bedtime., Disp: 90 tablet, Rfl: 3 .  spironolactone (ALDACTONE) 25 MG tablet, Take 1 tablet (25 mg total) by mouth daily., Disp: 90 tablet, Rfl: 3 .  albuterol (PROVENTIL HFA;VENTOLIN HFA) 108 (90 Base) MCG/ACT inhaler, Inhale 2 puffs into the lungs every 6 (six) hours as needed for wheezing., Disp: 1 Inhaler, Rfl: 0 .  benzonatate (TESSALON PERLES) 100 MG capsule, Take 1 capsule (100 mg total) by mouth 3 (three) times daily as needed for cough., Disp: 15 capsule, Rfl: 0 .  doxycycline (VIBRAMYCIN) 100 MG capsule, Take 1 capsule (100 mg total) by mouth 2 (two) times daily., Disp: 20 capsule, Rfl: 0 .  predniSONE (DELTASONE) 20 MG tablet, Take 2 tablets (40 mg total) by mouth daily., Disp: 10 tablet, Rfl: 0  Allergies Patient has no known allergies.  Family History  Problem Relation Age of Onset  . Hypertension Mother   . Uterine cancer Mother   . CAD Brother     Social History Social History  Substance Use Topics  . Smoking status: Never Smoker  . Smokeless tobacco: Never Used  . Alcohol use 1.2 oz/week    2 Standard drinks or equivalent per week     Comment: occasional    Review of Systems Constitutional: As above. Eyes: No visual changes. ENT: No sore throat. Cardiovascular: Denies chest pain. Respiratory: Denies shortness of breath. Gastrointestinal: No abdominal pain.  No nausea, no vomiting.  No diarrhea.  No constipation. Genitourinary: Negative for dysuria. Musculoskeletal: Negative for back pain. Skin: Negative for rash. Neurological: Negative for headaches, focal weakness or  numbness.  10-point ROS otherwise negative.  ____________________________________________   PHYSICAL EXAM:  VITAL SIGNS: ED Triage Vitals  Enc Vitals Group     BP 09/11/16 1305 (!) 144/83     Pulse Rate 09/11/16 1305 79     Resp 09/11/16 1305 18     Temp 09/11/16 1305 98.9 F (37.2 C)     Temp Source 09/11/16 1305 Oral     SpO2 09/11/16 1305 99 %     Weight --      Height --        Head Circumference --      Peak Flow --      Pain Score 09/11/16 1310 0     Pain Loc --      Pain Edu? --      Excl. in [place]? --    Constitutional: Alert and oriented. Well appearing and in no acute distress. Eyes: Conjunctivae are normal. PERRL. EOMI. Head: Atraumatic.Mild to moderate tenderness to palpation bilateral maxillary sinusitis. No frontal sinus tenderness to palpation. No swelling. No erythema.   Ears: no erythema, normal TMs bilaterally.   Nose: nasal congestion with bilateral nasal turbinate erythema and edema.   Mouth/Throat: Mucous membranes are moist.  Oropharynx non-erythematous. No tonsillar swelling or exudate.  Neck: No stridor.  No cervical spine tenderness to palpation. Hematological/Lymphatic/Immunilogical: No cervical lymphadenopathy. Cardiovascular: Normal rate, regular rhythm. Grossly normal heart sounds.  Good peripheral circulation. Respiratory: Normal respiratory effort.  No retractions. No wheezes, rales or rhonchi. Good air movement. Dry intermittent cough noted in room with mild bronchospasm. Speaks in complete sentences. Gastrointestinal: Soft and nontender.  Musculoskeletal: No lower extremity tenderness nor edema.  No cervical, thoracic or lumbar tenderness to palpation.  Neurologic:  Normal speech and language. No gait instability. Skin:  Skin is warm, dry and intact. No rash noted. Psychiatric: Mood and affect are normal. Speech and behavior are normal.  ___________________________________________   LABS (all labs ordered are listed, but only abnormal results are displayed)  Labs Reviewed - No data to display  PROCEDURES Procedures   INITIAL IMPRESSION / ASSESSMENT AND PLAN / ED COURSE  Pertinent labs & imaging results that were available during my care of the patient were reviewed by me and considered in my medical decision making (see chart for details).  Well-appearing patient. No acute distress. Suspect sinusitis and postnasal  drainage cough. Patient does have a mild bronchospasm with cough, will treat patient with prednisone as well as albuterol inhaler as needed. Also treat with oral doxycycline and when necessary Tessalon Perles. Encouraged rest, fluids and supportive care. Encourage PCP follow up.Discussed indication, risks and benefits of medications with patient.  Discussed follow up with Primary care physician this week. Discussed follow up and return parameters including no resolution or any worsening concerns. Patient verbalized understanding and agreed to plan.   ____________________________________________   FINAL CLINICAL IMPRESSION(S) / ED DIAGNOSES  Final diagnoses:  Acute maxillary sinusitis, recurrence not specified  Cough     Discharge Medication List as of 09/11/2016  2:06 PM    START taking these medications   Details  albuterol (PROVENTIL HFA;VENTOLIN HFA) 108 (90 Base) MCG/ACT inhaler Inhale 2 puffs into the lungs every 6 (six) hours as needed for wheezing., Starting Thu 09/11/2016, Normal    benzonatate (TESSALON PERLES) 100 MG capsule Take 1 capsule (100 mg total) by mouth 3 (three) times daily as needed for cough., Starting Thu 09/11/2016, Normal    doxycycline (VIBRAMYCIN) 100 MG capsule Take 1 capsule (100  mg total) by mouth 2 (two) times daily., Starting Thu 09/11/2016, Normal    predniSONE (DELTASONE) 20 MG tablet Take 2 tablets (40 mg total) by mouth daily., Starting Thu 09/11/2016, Normal        Note: This dictation was prepared with Dragon dictation along with smaller phrase technology. Any transcriptional errors that result from this process are unintentional.         Marylene Land, NP 09/11/16 1846

## 2016-09-11 NOTE — Discharge Instructions (Signed)
Take medication as prescribed. Rest. Drink plenty of fluids.  ° °Follow up with your primary care physician this week as needed. Return to Urgent care for new or worsening concerns.  ° °

## 2017-02-26 ENCOUNTER — Encounter: Payer: Managed Care, Other (non HMO) | Admitting: Internal Medicine

## 2017-04-10 ENCOUNTER — Ambulatory Visit (INDEPENDENT_AMBULATORY_CARE_PROVIDER_SITE_OTHER): Payer: 59 | Admitting: Internal Medicine

## 2017-04-10 ENCOUNTER — Encounter: Payer: Self-pay | Admitting: Internal Medicine

## 2017-04-10 VITALS — BP 128/82 | HR 74 | Ht 65.0 in | Wt 235.0 lb

## 2017-04-10 DIAGNOSIS — K219 Gastro-esophageal reflux disease without esophagitis: Secondary | ICD-10-CM

## 2017-04-10 DIAGNOSIS — Z1211 Encounter for screening for malignant neoplasm of colon: Secondary | ICD-10-CM | POA: Diagnosis not present

## 2017-04-10 DIAGNOSIS — E782 Mixed hyperlipidemia: Secondary | ICD-10-CM

## 2017-04-10 DIAGNOSIS — G5602 Carpal tunnel syndrome, left upper limb: Secondary | ICD-10-CM | POA: Diagnosis not present

## 2017-04-10 DIAGNOSIS — Z0000 Encounter for general adult medical examination without abnormal findings: Secondary | ICD-10-CM

## 2017-04-10 DIAGNOSIS — I10 Essential (primary) hypertension: Secondary | ICD-10-CM | POA: Diagnosis not present

## 2017-04-10 DIAGNOSIS — Z1239 Encounter for other screening for malignant neoplasm of breast: Secondary | ICD-10-CM

## 2017-04-10 LAB — POCT URINALYSIS DIPSTICK
Bilirubin, UA: NEGATIVE
GLUCOSE UA: NEGATIVE
Ketones, UA: NEGATIVE
Leukocytes, UA: NEGATIVE
Nitrite, UA: NEGATIVE
Protein, UA: NEGATIVE
RBC UA: NEGATIVE
SPEC GRAV UA: 1.015 (ref 1.010–1.025)
UROBILINOGEN UA: 0.2 U/dL
pH, UA: 6 (ref 5.0–8.0)

## 2017-04-10 NOTE — Progress Notes (Signed)
Date:  04/10/2017   Name:  Molly Kelly   DOB:  May 18, 1956   MRN:  [id number]   Chief Complaint: Annual Exam (Breast Exam. ) Molly Kelly is a 61 y.o. female who presents today for her Complete Annual Exam. She feels well. She reports exercising walking some. She reports she is sleeping fairly well. Her grandson has been hospitalized for a long time with an immune disorder- now doing better back home. She denies any breast problems. She is due for a mammogram. She did not get her colonoscopy scheduled last year-she rescheduled it on several occasions but had repeated conflicts.  Hypertension  This is a chronic problem. The problem is unchanged. The problem is controlled. Pertinent negatives include no chest pain, headaches, palpitations or shortness of breath. Past treatments include ACE inhibitors. The current treatment provides significant improvement.  Hyperlipidemia  This is a chronic problem. Pertinent negatives include no chest pain or shortness of breath. Current antihyperlipidemic treatment includes statins.  Gastroesophageal Reflux  She complains of heartburn. She reports no abdominal pain, no chest pain, no coughing or no wheezing. Pertinent negatives include no fatigue. She has tried a histamine-2 antagonist for the symptoms.  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  Now having similar sx on left hand. She would like to be referred.  She does not remember who she saw in the past.    Review of Systems  Constitutional: Negative for chills, fatigue and fever.  HENT: Negative for congestion, hearing loss, tinnitus, trouble swallowing and voice change.   Eyes: Negative for visual disturbance.  Respiratory: Negative for cough, chest tightness, shortness of breath and wheezing.   Cardiovascular: Negative for chest pain, palpitations and leg swelling.  Gastrointestinal: Positive for heartburn. Negative for abdominal pain, constipation, diarrhea and vomiting.  Endocrine:  Negative for polydipsia and polyuria.  Genitourinary: Negative for dysuria, frequency, genital sores, vaginal bleeding and vaginal discharge.  Musculoskeletal: Negative for arthralgias, gait problem and joint swelling.  Skin: Negative for color change and rash.  Neurological: Positive for numbness (in left hand at time). Negative for dizziness, tremors, light-headedness and headaches.  Hematological: Negative for adenopathy. Does not bruise/bleed easily.  Psychiatric/Behavioral: Negative for dysphoric mood and sleep disturbance. The patient is not nervous/anxious.     Patient Active Problem List   Diagnosis Date Noted  . Arteriosclerosis of coronary artery 05/02/2015  . Gastroesophageal reflux disease 05/02/2015  . HLD (hyperlipidemia) 05/02/2015  . Benign hypertension 05/02/2015    Prior to Admission medications   Medication Sig Start Date End Date Taking? Authorizing Provider  aspirin 81 MG tablet Take 1 tablet by mouth daily.   Yes [provider]  enalapril (VASOTEC) 10 MG tablet Take 1 tablet (10 mg total) by mouth daily. 02/26/16  Yes Glean Hess, MD  ranitidine (ZANTAC) 300 MG capsule Take 1 capsule (300 mg total) by mouth daily. 02/26/16  Yes Glean Hess, MD  simvastatin (ZOCOR) 40 MG tablet Take 1 tablet (40 mg total) by mouth at bedtime. 02/26/16  Yes Glean Hess, MD  spironolactone (ALDACTONE) 25 MG tablet Take 1 tablet (25 mg total) by mouth daily. 02/26/16  Yes Glean Hess, MD    No Known Allergies  Past Surgical History:  Procedure Laterality Date  . NO PAST SURGERIES      Social History  Substance Use Topics  . Smoking status: Never Smoker  . Smokeless tobacco: Never Used  . Alcohol use 1.2 oz/week  2 Standard drinks or equivalent per week     Comment: occasional     Medication list has been reviewed and updated.  PHQ 2/9 Scores 04/10/2017 02/26/2016  PHQ - 2 Score 0 0    Physical Exam  Constitutional: She is oriented to  person, place, and time. She appears well-developed and well-nourished. No distress.  HENT:  Head: Normocephalic and atraumatic.  Right Ear: Tympanic membrane and ear canal normal.  Left Ear: Tympanic membrane and ear canal normal.  Nose: Right sinus exhibits no maxillary sinus tenderness. Left sinus exhibits no maxillary sinus tenderness.  Mouth/Throat: Uvula is midline and oropharynx is clear and moist.  Eyes: Conjunctivae and EOM are normal. Right eye exhibits no discharge. Left eye exhibits no discharge. No scleral icterus.  Neck: Normal range of motion. Carotid bruit is not present. No erythema present. No thyromegaly present.  Cardiovascular: Normal rate, regular rhythm, normal heart sounds and normal pulses.   Pulmonary/Chest: Effort normal. No respiratory distress. She has no wheezes. Right breast exhibits no mass, no nipple discharge, no skin change and no tenderness. Left breast exhibits no mass, no nipple discharge, no skin change and no tenderness.  Abdominal: Soft. Bowel sounds are normal. There is no hepatosplenomegaly. There is no tenderness. There is no CVA tenderness.  Musculoskeletal: Normal range of motion.  + Tinel's and Phalen's on left  Lymphadenopathy:    She has no cervical adenopathy.    She has no axillary adenopathy.  Neurological: She is alert and oriented to person, place, and time. She has normal strength and normal reflexes. No cranial nerve deficit or sensory deficit.  Skin: Skin is warm, dry and intact. No rash noted.  Psychiatric: She has a normal mood and affect. Her speech is normal and behavior is normal. Thought content normal.  Nursing note and vitals reviewed.   BP 128/82   Pulse 74   Ht 5\' 5"  (1.651 m)   Wt 235 lb (106.6 kg)   SpO2 100%   BMI 39.11 kg/m   Assessment and Plan: 1. Annual physical exam Normal exam except for weight - encouraged resumption of healthy diet and begin regular exercise - POCT Urinalysis Dipstick  2. Breast cancer  screening - MM DIGITAL SCREENING BILATERAL; Future  3. Benign hypertension controlled - Comprehensive metabolic panel - TSH  4. Mixed hyperlipidemia On statin therapy - Lipid panel  5. Gastroesophageal reflux disease, esophagitis presence not specified Controlled on H2 blocker - CBC with Differential/Platelet  6. Carpal tunnel syndrome of left wrist - Ambulatory referral to Orthopedic Surgery  7. Colon cancer screening Has not been able to schedule colonoscopy - will order cologuard - Cologuard   No orders of the defined types were placed in this encounter.   Partially dictated using Editor, commissioning. Any errors are unintentional.  Halina Maidens, MD [facility]  04/10/2017

## 2017-04-10 NOTE — Patient Instructions (Signed)
Call insurance to see if Cologuard is covered.  If it is NOT - throw away the kit.  If it IS covered, then go ahead and send in a specimen.

## 2017-04-12 LAB — TSH: TSH: 0.914 u[IU]/mL (ref 0.450–4.500)

## 2017-04-12 LAB — CBC WITH DIFFERENTIAL/PLATELET
BASOS ABS: 0 10*3/uL (ref 0.0–0.2)
Basos: 0 %
EOS (ABSOLUTE): 0.1 10*3/uL (ref 0.0–0.4)
Eos: 2 %
HEMATOCRIT: 37.9 % (ref 34.0–46.6)
HEMOGLOBIN: 12.4 g/dL (ref 11.1–15.9)
IMMATURE GRANS (ABS): 0 10*3/uL (ref 0.0–0.1)
Immature Granulocytes: 0 %
LYMPHS ABS: 2.3 10*3/uL (ref 0.7–3.1)
LYMPHS: 48 %
MCH: 30.5 pg (ref 26.6–33.0)
MCHC: 32.7 g/dL (ref 31.5–35.7)
MCV: 93 fL (ref 79–97)
MONOCYTES: 7 %
Monocytes Absolute: 0.3 10*3/uL (ref 0.1–0.9)
NEUTROS ABS: 2 10*3/uL (ref 1.4–7.0)
Neutrophils: 43 %
PLATELETS: 273 10*3/uL (ref 150–379)
RBC: 4.06 x10E6/uL (ref 3.77–5.28)
RDW: 13.5 % (ref 12.3–15.4)
WBC: 4.7 10*3/uL (ref 3.4–10.8)

## 2017-04-12 LAB — COMPREHENSIVE METABOLIC PANEL
A/G RATIO: 2 (ref 1.2–2.2)
ALT: 15 IU/L (ref 0–32)
AST: 15 IU/L (ref 0–40)
Albumin: 4.5 g/dL (ref 3.5–5.5)
Alkaline Phosphatase: 57 IU/L (ref 39–117)
BILIRUBIN TOTAL: 0.4 mg/dL (ref 0.0–1.2)
BUN/Creatinine Ratio: 11 (ref 9–23)
BUN: 7 mg/dL (ref 6–24)
CALCIUM: 10.2 mg/dL (ref 8.7–10.2)
CHLORIDE: 106 mmol/L (ref 96–106)
CO2: 23 mmol/L (ref 20–29)
Creatinine, Ser: 0.63 mg/dL (ref 0.57–1.00)
GFR calc non Af Amer: 98 mL/min/{1.73_m2} (ref >59)
GFR, EST AFRICAN AMERICAN: 114 mL/min/{1.73_m2} (ref >59)
GLUCOSE: 93 mg/dL (ref 65–99)
Globulin, Total: 2.3 g/dL (ref 1.5–4.5)
POTASSIUM: 4.1 mmol/L (ref 3.5–5.2)
Sodium: 143 mmol/L (ref 134–144)
TOTAL PROTEIN: 6.8 g/dL (ref 6.0–8.5)

## 2017-04-12 LAB — LIPID PANEL
CHOL/HDL RATIO: 4.9 ratio — AB (ref 0.0–4.4)
Cholesterol, Total: 190 mg/dL (ref 100–199)
HDL: 39 mg/dL — ABNORMAL LOW (ref >39)
LDL CALC: 130 mg/dL — AB (ref 0–99)
TRIGLYCERIDES: 104 mg/dL (ref 0–149)
VLDL Cholesterol Cal: 21 mg/dL (ref 5–40)

## 2017-04-30 ENCOUNTER — Other Ambulatory Visit: Payer: Self-pay | Admitting: Internal Medicine

## 2017-04-30 DIAGNOSIS — E785 Hyperlipidemia, unspecified: Secondary | ICD-10-CM

## 2017-05-10 ENCOUNTER — Other Ambulatory Visit: Payer: Self-pay | Admitting: Internal Medicine

## 2017-05-10 DIAGNOSIS — K219 Gastro-esophageal reflux disease without esophagitis: Secondary | ICD-10-CM

## 2017-05-14 ENCOUNTER — Other Ambulatory Visit: Payer: Self-pay | Admitting: Internal Medicine

## 2017-05-14 DIAGNOSIS — I10 Essential (primary) hypertension: Secondary | ICD-10-CM

## 2017-05-25 ENCOUNTER — Other Ambulatory Visit: Payer: Self-pay

## 2017-05-25 ENCOUNTER — Encounter: Payer: Self-pay | Admitting: Emergency Medicine

## 2017-05-25 ENCOUNTER — Ambulatory Visit
Admission: EM | Admit: 2017-05-25 | Discharge: 2017-05-25 | Disposition: A | Discharge status: Home / Self Care | Payer: 59 | Attending: Family Medicine | Admitting: Family Medicine

## 2017-05-25 DIAGNOSIS — J209 Acute bronchitis, unspecified: Secondary | ICD-10-CM | POA: Diagnosis not present

## 2017-05-25 DIAGNOSIS — R05 Cough: Secondary | ICD-10-CM | POA: Diagnosis not present

## 2017-05-25 MED ORDER — PREDNISONE 50 MG PO TABS: ORAL_TABLET | ORAL | 0 refills | Status: DC

## 2017-05-25 MED ORDER — HYDROCOD POLST-CPM POLST ER 10-8 MG/5ML PO SUER: 5.0000 mL | Freq: Two times a day (BID) | ORAL | 0 refills | Status: DC | PRN

## 2017-05-25 NOTE — ED Triage Notes (Signed)
Patient in today c/o 1 week history of chest congestion and cough. Patient denies fever. Patient has been using Robitusin without relief.

## 2017-05-25 NOTE — ED Provider Notes (Signed)
[facility]    CSN: [id number] Arrival date & time: 05/25/17  1201  History   Chief Complaint Chief Complaint  Patient presents with  . chest congestion    cough   HPI  61 year old female presents with the above complaint.  Patient reports a one-week history of cough and congestion.  Cough is mildly productive.  She states that cough has been worsening over the weekend.  No fever.  No shortness of breath.  She has been taking Robitussin without improvement.  No known exacerbating relieving factors.  No other associated symptoms.  No other complaints at this time.   Past Medical History:  Diagnosis Date  . Acid reflux   . Hypercholesteremia   . Hypertension    Patient Active Problem List   Diagnosis Date Noted  . Arteriosclerosis of coronary artery 05/02/2015  . Gastroesophageal reflux disease 05/02/2015  . HLD (hyperlipidemia) 05/02/2015  . Benign hypertension 05/02/2015   Past Surgical History:  Procedure Laterality Date  . CARPAL TUNNEL RELEASE Right   . NO PAST SURGERIES     OB History    No data available     Home Medications    Prior to Admission medications   Medication Sig Start Date End Date Taking? Authorizing Provider  aspirin 81 MG tablet Take 1 tablet by mouth daily.   Yes [provider]  enalapril (VASOTEC) 10 MG tablet Take 1 tablet (10 mg total) by mouth daily. 02/26/16  Yes Glean Hess, MD  ranitidine (ZANTAC) 300 MG tablet TAKE 1 TABLET (300 MG TOTAL) BY MOUTH DAILY. 05/10/17  Yes Glean Hess, MD  simvastatin (ZOCOR) 40 MG tablet TAKE 1 TABLET (40 MG TOTAL) BY MOUTH AT BEDTIME. 04/30/17  Yes Glean Hess, MD  spironolactone (ALDACTONE) 25 MG tablet TAKE 1 TABLET (25 MG TOTAL) BY MOUTH DAILY. 05/14/17  Yes Glean Hess, MD  chlorpheniramine-HYDROcodone [facility] PENNKINETIC ER) 10-8 MG/5ML SUER Take 5 mLs by mouth every 12 (twelve) hours as needed. 05/25/17   Coral Spikes, DO  predniSONE (DELTASONE) 50 MG  tablet 1 tablet daily x 5 days. 05/25/17   Coral Spikes, DO   Family History Family History  Problem Relation Age of Onset  . Hypertension Mother   . Uterine cancer Mother   . CAD Brother    Social History Social History   Tobacco Use  . Smoking status: Never Smoker  . Smokeless tobacco: Never Used  Substance Use Topics  . Alcohol use: Yes    Comment: socially  . Drug use: No   Allergies   Patient has no known allergies.  Review of Systems Review of Systems  Constitutional: Negative.   Respiratory: Positive for cough. Negative for shortness of breath.    Physical Exam Triage Vital Signs ED Triage Vitals [05/25/17 1217]  Enc Vitals Group     BP 136/81     Pulse Rate 84     Resp 16     Temp 98.2 F (36.8 C)     Temp Source Oral     SpO2 100 %     Weight 230 lb (104.3 kg)     Height 5\' 5"  (1.651 m)     Head Circumference      Peak Flow      Pain Score 0     Pain Loc      Pain Edu?      Excl. in [place]?    No data found.  Updated Vital Signs BP  136/81 (BP Location: Left Arm)   Pulse 84   Temp 98.2 F (36.8 C) (Oral)   Resp 16   Ht 5\' 5"  (1.651 m)   Wt 230 lb (104.3 kg)   SpO2 100%   BMI 38.27 kg/m   Physical Exam  Constitutional: She is oriented to person, place, and time. She appears well-developed. No distress.  HENT:  Head: Normocephalic and atraumatic.  Mouth/Throat: Oropharynx is clear and moist.  Eyes: Conjunctivae are normal. Right eye exhibits no discharge. Left eye exhibits no discharge.  Cardiovascular: Normal rate and regular rhythm.  No murmur heard. Pulmonary/Chest: Effort normal and breath sounds normal. She has no wheezes. She has no rales.  Neurological: She is alert and oriented to person, place, and time.  Skin: Skin is warm. No rash noted.  Psychiatric: She has a normal mood and affect. Her behavior is normal.  Vitals reviewed.  UC Treatments / Results  Labs (all labs ordered are listed, but only abnormal results are  displayed) Labs Reviewed - No data to display  EKG  EKG Interpretation None       Radiology No results found.  Procedures Procedures (including critical care time)  Medications Ordered in UC Medications - No data to display   Initial Impression / Assessment and Plan / UC Course  I have reviewed the triage vital signs and the nursing notes.  Pertinent labs & imaging results that were available during my care of the patient were reviewed by me and considered in my medical decision making (see chart for details).    61 year old female presents with acute bronchitis.  Treated with prednisone and tussionex.  Final Clinical Impressions(s) / UC Diagnoses   Final diagnoses:  Acute bronchitis, unspecified organism    ED Discharge Orders        Ordered    predniSONE (DELTASONE) 50 MG tablet     05/25/17 1254    chlorpheniramine-HYDROcodone (TUSSIONEX PENNKINETIC ER) 10-8 MG/5ML SUER  Every 12 hours PRN     05/25/17 1254     Controlled Substance Prescriptions [place] Controlled Substance Registry consulted? Not Applicable   Coral Spikes, DO 05/25/17 1402

## 2017-05-25 NOTE — Discharge Instructions (Signed)
This is bronchitis.  The data does not support the use of antibiotics.  Take the prednisone and use the cough medication as needed.  Take care  Dr. Lacinda Axon

## 2017-06-09 ENCOUNTER — Other Ambulatory Visit: Payer: Self-pay | Admitting: Internal Medicine

## 2017-06-09 DIAGNOSIS — I10 Essential (primary) hypertension: Secondary | ICD-10-CM

## 2017-08-12 ENCOUNTER — Encounter: Payer: Managed Care, Other (non HMO) | Admitting: Internal Medicine

## 2017-10-13 ENCOUNTER — Ambulatory Visit: Payer: 59 | Admitting: Internal Medicine

## 2017-10-29 ENCOUNTER — Other Ambulatory Visit: Payer: Self-pay | Admitting: Internal Medicine

## 2017-10-29 DIAGNOSIS — I10 Essential (primary) hypertension: Secondary | ICD-10-CM

## 2017-12-07 ENCOUNTER — Ambulatory Visit
Admission: EM | Admit: 2017-12-07 | Discharge: 2017-12-07 | Disposition: A | Discharge status: Home / Self Care | Payer: 59 | Attending: Family Medicine | Admitting: Family Medicine

## 2017-12-07 ENCOUNTER — Other Ambulatory Visit: Payer: Self-pay

## 2017-12-07 DIAGNOSIS — H10022 Other mucopurulent conjunctivitis, left eye: Secondary | ICD-10-CM | POA: Diagnosis not present

## 2017-12-07 MED ORDER — MOXIFLOXACIN HCL 0.5 % OP SOLN: 1.0000 [drp] | Freq: Three times a day (TID) | OPHTHALMIC | 0 refills | Status: DC

## 2017-12-07 NOTE — ED Provider Notes (Signed)
[facility]    CSN: [id number] Arrival date & time: 12/07/17  1241     History   Chief Complaint Chief Complaint  Patient presents with  . Eye Problem    HPI Molly Kelly is a 62 y.o. female.   The history is provided by the patient.  Eye Problem  Location:  Both eyes (left greater than right) Quality:  Tearing Severity:  Moderate Onset quality:  Sudden Duration:  2 days Timing:  Constant Progression:  Worsening Chronicity:  New Context: not burn, not chemical exposure, not contact lens problem, not direct trauma, not foreign body, not using machinery, not scratch, not smoke exposure and not UV exposure   Ineffective treatments:  None tried Associated symptoms: crusting, redness and tearing   Associated symptoms: no blurred vision, no decreased vision, no discharge, no double vision, no facial rash, no headaches, no inflammation, no itching, no nausea, no numbness, no photophobia, no scotomas, no swelling, no tingling, no vomiting and no weakness   Risk factors: no conjunctival hemorrhage, no exposure to pinkeye, no previous injury to eye, no recent herpes zoster and no recent URI     Past Medical History:  Diagnosis Date  . Acid reflux   . Hypercholesteremia   . Hypertension     Patient Active Problem List   Diagnosis Date Noted  . Arteriosclerosis of coronary artery 05/02/2015  . Gastroesophageal reflux disease 05/02/2015  . HLD (hyperlipidemia) 05/02/2015  . Benign hypertension 05/02/2015    Past Surgical History:  Procedure Laterality Date  . CARPAL TUNNEL RELEASE Right   . NO PAST SURGERIES      OB History   None      Home Medications    Prior to Admission medications   Medication Sig Start Date End Date Taking? Authorizing Provider  aspirin 81 MG tablet Take 1 tablet by mouth daily.    [provider]  chlorpheniramine-HYDROcodone (TUSSIONEX PENNKINETIC ER) 10-8 MG/5ML SUER Take 5 mLs by mouth every 12 (twelve) hours  as needed. 05/25/17   Thersa Salt G, DO  enalapril (VASOTEC) 10 MG tablet TAKE 1 TABLET (10 MG TOTAL) BY MOUTH DAILY. 06/09/17   Glean Hess, MD  moxifloxacin (VIGAMOX) 0.5 % ophthalmic solution Place 1 drop into both eyes 3 (three) times daily. 12/07/17   Norval Gable, MD  predniSONE (DELTASONE) 50 MG tablet 1 tablet daily x 5 days. 05/25/17   Coral Spikes, DO  ranitidine (ZANTAC) 300 MG tablet TAKE 1 TABLET (300 MG TOTAL) BY MOUTH DAILY. 05/10/17   Glean Hess, MD  simvastatin (ZOCOR) 40 MG tablet TAKE 1 TABLET (40 MG TOTAL) BY MOUTH AT BEDTIME. 04/30/17   Glean Hess, MD  spironolactone (ALDACTONE) 25 MG tablet TAKE 1 TABLET (25 MG TOTAL) BY MOUTH DAILY. 10/29/17   Glean Hess, MD    Family History Family History  Problem Relation Age of Onset  . Hypertension Mother   . Uterine cancer Mother   . CAD Brother     Social History Social History   Tobacco Use  . Smoking status: Never Smoker  . Smokeless tobacco: Never Used  Substance Use Topics  . Alcohol use: Yes    Comment: socially  . Drug use: No     Allergies   Patient has no known allergies.   Review of Systems Review of Systems  Eyes: Positive for redness. Negative for blurred vision, double vision, photophobia, discharge and itching.  Gastrointestinal: Negative for nausea and vomiting.  Neurological: Negative for tingling, weakness, numbness and headaches.     Physical Exam Triage Vital Signs ED Triage Vitals  Enc Vitals Group     BP 12/07/17 1252 (!) 152/99     Pulse Rate 12/07/17 1252 90     Resp 12/07/17 1252 18     Temp 12/07/17 1252 98.3 F (36.8 C)     Temp Source 12/07/17 1252 Oral     SpO2 12/07/17 1252 99 %     Weight 12/07/17 1253 235 lb (106.6 kg)     Height 12/07/17 1253 5\' 5"  (1.651 m)     Head Circumference --      Peak Flow --      Pain Score 12/07/17 1253 1     Pain Loc --      Pain Edu? --      Excl. in [place]? --    No data found.  Updated Vital Signs BP (!)  152/99 (BP Location: Left Arm) Comment: has not taken b/p med today  Pulse 90   Temp 98.3 F (36.8 C) (Oral)   Resp 18   Ht 5\' 5"  (1.651 m)   Wt 235 lb (106.6 kg)   SpO2 99%   BMI 39.11 kg/m   Visual Acuity Right Eye Distance:   Left Eye Distance:   Bilateral Distance:    Right Eye Near:   Left Eye Near:    Bilateral Near:     Physical Exam  Constitutional: She appears well-developed and well-nourished. No distress.  Eyes: Pupils are equal, round, and reactive to light. Lids are normal. Left eye exhibits discharge. Right conjunctiva is injected. Left conjunctiva is injected.  Skin: She is not diaphoretic.  Nursing note and vitals reviewed.    UC Treatments / Results  Labs (all labs ordered are listed, but only abnormal results are displayed) Labs Reviewed - No data to display  EKG None  Radiology No results found.  Procedures Procedures (including critical care time)  Medications Ordered in UC Medications - No data to display  Initial Impression / Assessment and Plan / UC Course  I have reviewed the triage vital signs and the nursing notes.  Pertinent labs & imaging results that were available during my care of the patient were reviewed by me and considered in my medical decision making (see chart for details).      Final Clinical Impressions(s) / UC Diagnoses   Final diagnoses:  Other mucopurulent conjunctivitis of left eye   Discharge Instructions   None    ED Prescriptions    Medication Sig Dispense Auth. Provider   moxifloxacin (VIGAMOX) 0.5 % ophthalmic solution Place 1 drop into both eyes 3 (three) times daily. 3 mL Norval Gable, MD     1.diagnosis reviewed with patient 2. rx as per orders above; reviewed possible side effects, interactions, risks and benefits  3. Recommend supportive treatment with cool compresses 4. Follow-up prn if symptoms worsen or don't improve Controlled Substance Prescriptions [place] Controlled Substance Registry  consulted? Not Applicable   Norval Gable, MD 12/07/17 [phone]

## 2017-12-07 NOTE — ED Triage Notes (Addendum)
Pt states eyes feel "gritty" and irritated. Then throat started to get scratchy.  VISUAL ACUITY: Right eye corrected 20/40 -1 Left eye corrected 20/40 -1 Both eyes corrected 20/40

## 2018-04-13 ENCOUNTER — Encounter: Payer: Self-pay | Admitting: Internal Medicine

## 2018-04-13 ENCOUNTER — Ambulatory Visit (INDEPENDENT_AMBULATORY_CARE_PROVIDER_SITE_OTHER): Payer: Managed Care, Other (non HMO) | Admitting: Internal Medicine

## 2018-04-13 ENCOUNTER — Other Ambulatory Visit: Payer: Self-pay

## 2018-04-13 VITALS — BP 124/84 | HR 98 | Ht 65.0 in | Wt 231.0 lb

## 2018-04-13 DIAGNOSIS — E782 Mixed hyperlipidemia: Secondary | ICD-10-CM | POA: Diagnosis not present

## 2018-04-13 DIAGNOSIS — I10 Essential (primary) hypertension: Secondary | ICD-10-CM | POA: Diagnosis not present

## 2018-04-13 DIAGNOSIS — Z23 Encounter for immunization: Secondary | ICD-10-CM

## 2018-04-13 DIAGNOSIS — Z1211 Encounter for screening for malignant neoplasm of colon: Secondary | ICD-10-CM

## 2018-04-13 DIAGNOSIS — Z0000 Encounter for general adult medical examination without abnormal findings: Secondary | ICD-10-CM | POA: Diagnosis not present

## 2018-04-13 DIAGNOSIS — Z6838 Body mass index (BMI) 38.0-38.9, adult: Secondary | ICD-10-CM | POA: Diagnosis not present

## 2018-04-13 DIAGNOSIS — Z1231 Encounter for screening mammogram for malignant neoplasm of breast: Secondary | ICD-10-CM | POA: Diagnosis not present

## 2018-04-13 DIAGNOSIS — K219 Gastro-esophageal reflux disease without esophagitis: Secondary | ICD-10-CM | POA: Diagnosis not present

## 2018-04-13 LAB — POCT URINALYSIS DIPSTICK
Bilirubin, UA: NEGATIVE
Blood, UA: NEGATIVE
Glucose, UA: NEGATIVE
Ketones, UA: NEGATIVE
Leukocytes, UA: NEGATIVE
NITRITE UA: NEGATIVE
PH UA: 6 (ref 5.0–8.0)
PROTEIN UA: NEGATIVE
Spec Grav, UA: 1.02 (ref 1.010–1.025)
UROBILINOGEN UA: 0.2 U/dL

## 2018-04-13 MED ORDER — SPIRONOLACTONE 25 MG PO TABS: 25.0000 mg | ORAL_TABLET | Freq: Every day | ORAL | 3 refills | Status: DC

## 2018-04-13 MED ORDER — NA SULFATE-K SULFATE-MG SULF 17.5-3.13-1.6 GM/177ML PO SOLN: 1.0000 | Freq: Once | ORAL | 0 refills | Status: AC

## 2018-04-13 MED ORDER — SIMVASTATIN 40 MG PO TABS: 40.0000 mg | ORAL_TABLET | Freq: Every day | ORAL | 3 refills | Status: DC

## 2018-04-13 MED ORDER — RANITIDINE HCL 300 MG PO TABS: 300.0000 mg | ORAL_TABLET | Freq: Every day | ORAL | 3 refills | Status: DC

## 2018-04-13 MED ORDER — ENALAPRIL MALEATE 10 MG PO TABS: 10.0000 mg | ORAL_TABLET | Freq: Every day | ORAL | 3 refills | Status: DC

## 2018-04-13 NOTE — Patient Instructions (Signed)

## 2018-04-13 NOTE — Progress Notes (Signed)
Date:  04/13/2018   Name:  Molly Kelly   DOB:  08-Sep-1955   MRN:  [id number]   Chief Complaint: Annual Exam (Breast Exam. Pap in three more years. ) and Immunizations (Reg dose flu shot. ) Molly Kelly is a 62 y.o. female who presents today for her Complete Annual Exam. She feels well. She reports exercising some. She reports she is sleeping fairly well. She denies breast issues and needs to schedule mammogram.  She is also overdue for colonoscopy.  Hypertension  This is a chronic problem. The problem is controlled. Pertinent negatives include no chest pain, headaches, palpitations or shortness of breath. Past treatments include ACE inhibitors. There are no compliance problems.   Hyperlipidemia  This is a chronic problem. The problem is controlled. Pertinent negatives include no chest pain or shortness of breath. Current antihyperlipidemic treatment includes statins. The current treatment provides significant improvement of lipids.  Gastroesophageal Reflux  She reports no abdominal pain, no chest pain, no choking, no coughing, no heartburn, no hoarse voice or no wheezing. This is a recurrent problem. The problem occurs occasionally. Pertinent negatives include no fatigue. She has tried a PPI for the symptoms. The treatment provided significant relief.    Review of Systems  Constitutional: Negative for chills, fatigue and fever.  HENT: Negative for congestion, hearing loss, hoarse voice, tinnitus, trouble swallowing and voice change.   Eyes: Negative for visual disturbance.  Respiratory: Negative for cough, choking, chest tightness, shortness of breath and wheezing.   Cardiovascular: Negative for chest pain, palpitations and leg swelling.  Gastrointestinal: Negative for abdominal pain, constipation, diarrhea, heartburn and vomiting.  Endocrine: Negative for polydipsia and polyuria.  Genitourinary: Negative for dysuria, frequency, genital sores, vaginal bleeding and vaginal  discharge.  Musculoskeletal: Negative for arthralgias, gait problem and joint swelling.  Skin: Negative for color change and rash.  Neurological: Negative for dizziness, tremors, light-headedness and headaches.  Hematological: Negative for adenopathy. Does not bruise/bleed easily.  Psychiatric/Behavioral: Negative for dysphoric mood and sleep disturbance. The patient is not nervous/anxious.     Patient Active Problem List   Diagnosis Date Noted  . Coronary artery disease involving native heart 05/02/2015  . Gastroesophageal reflux disease 05/02/2015  . HLD (hyperlipidemia) 05/02/2015  . Benign hypertension 05/02/2015    No Known Allergies  Past Surgical History:  Procedure Laterality Date  . CARPAL TUNNEL RELEASE Right   . NO PAST SURGERIES      Social History   Tobacco Use  . Smoking status: Never Smoker  . Smokeless tobacco: Never Used  Substance Use Topics  . Alcohol use: Yes    Comment: socially  . Drug use: No     Medication list has been reviewed and updated.  Current Meds  Medication Sig  . aspirin 81 MG tablet Take 1 tablet by mouth daily.  . enalapril (VASOTEC) 10 MG tablet TAKE 1 TABLET (10 MG TOTAL) BY MOUTH DAILY.  . ranitidine (ZANTAC) 300 MG tablet TAKE 1 TABLET (300 MG TOTAL) BY MOUTH DAILY.  . simvastatin (ZOCOR) 40 MG tablet TAKE 1 TABLET (40 MG TOTAL) BY MOUTH AT BEDTIME.  Marland Kitchen spironolactone (ALDACTONE) 25 MG tablet TAKE 1 TABLET (25 MG TOTAL) BY MOUTH DAILY.    PHQ 2/9 Scores 04/13/2018 04/10/2017 02/26/2016  PHQ - 2 Score 0 0 0    Physical Exam  Constitutional: She is oriented to person, place, and time. She appears well-developed and well-nourished. No distress.  HENT:  Head: Normocephalic and atraumatic.  Right  Ear: Tympanic membrane and ear canal normal.  Left Ear: Tympanic membrane and ear canal normal.  Nose: Right sinus exhibits no maxillary sinus tenderness. Left sinus exhibits no maxillary sinus tenderness.  Mouth/Throat: Uvula is  midline and oropharynx is clear and moist.  Eyes: Conjunctivae and EOM are normal. Right eye exhibits no discharge. Left eye exhibits no discharge. No scleral icterus.  Neck: Normal range of motion. Carotid bruit is not present. No erythema present. No thyromegaly present.  Cardiovascular: Normal rate, regular rhythm, normal heart sounds and normal pulses.  No murmur heard. Pulmonary/Chest: Effort normal and breath sounds normal. No respiratory distress. She has no wheezes. Right breast exhibits no mass, no nipple discharge, no skin change and no tenderness. Left breast exhibits no mass, no nipple discharge, no skin change and no tenderness.  Abdominal: Soft. Bowel sounds are normal. There is no hepatosplenomegaly. There is no tenderness. There is no CVA tenderness.  Musculoskeletal: Normal range of motion. She exhibits no edema or tenderness.  Lymphadenopathy:    She has no cervical adenopathy.    She has no axillary adenopathy.  Neurological: She is alert and oriented to person, place, and time. She has normal reflexes. No cranial nerve deficit or sensory deficit.  Skin: Skin is warm, dry and intact. No rash noted.  Psychiatric: She has a normal mood and affect. Her speech is normal and behavior is normal. Thought content normal.  Nursing note and vitals reviewed.   BP 124/84 (BP Location: Right Arm, Patient Position: Sitting, Cuff Size: Large)   Pulse 98   Ht 5\' 5"  (1.651 m)   Wt 231 lb (104.8 kg)   SpO2 100%   BMI 38.44 kg/m   Assessment and Plan: 1. Annual physical exam Continue regular exercise and diet changes for weight loss - POCT urinalysis dipstick  2. Encounter for screening mammogram for breast cancer Schedule at [facility]; Future  3. Colon cancer screening referred - Ambulatory referral to Gastroenterology  4. Benign hypertension controlled - Comprehensive metabolic panel - TSH - enalapril (VASOTEC) 10 MG tablet; Take 1 tablet (10 mg  total) by mouth daily.  Dispense: 90 tablet; Refill: 3 - spironolactone (ALDACTONE) 25 MG tablet; Take 1 tablet (25 mg total) by mouth daily.  Dispense: 90 tablet; Refill: 3  5. Gastroesophageal reflux disease, esophagitis presence not specified Doing well in H2 blocker - CBC with Differential/Platelet - ranitidine (ZANTAC) 300 MG tablet; Take 1 tablet (300 mg total) by mouth at bedtime.  Dispense: 90 tablet; Refill: 3  6. Mixed hyperlipidemia On statin therapy - Lipid panel - simvastatin (ZOCOR) 40 MG tablet; Take 1 tablet (40 mg total) by mouth at bedtime.  Dispense: 90 tablet; Refill: 3  7. Need for immunization against influenza - Flu Vaccine QUAD 36+ mos IM  8. BMI 38.0-38.9,adult Continue diet, exercise    Partially dictated using Editor, commissioning. Any errors are unintentional.  Halina Maidens, MD [facility]  04/13/2018

## 2018-04-14 LAB — CBC WITH DIFFERENTIAL/PLATELET
BASOS ABS: 0.1 10*3/uL (ref 0.0–0.2)
BASOS: 1 %
EOS (ABSOLUTE): 0.1 10*3/uL (ref 0.0–0.4)
Eos: 2 %
Hematocrit: 40.4 % (ref 34.0–46.6)
Hemoglobin: 13.3 g/dL (ref 11.1–15.9)
IMMATURE GRANS (ABS): 0 10*3/uL (ref 0.0–0.1)
IMMATURE GRANULOCYTES: 0 %
LYMPHS: 48 %
Lymphocytes Absolute: 2.2 10*3/uL (ref 0.7–3.1)
MCH: 31.2 pg (ref 26.6–33.0)
MCHC: 32.9 g/dL (ref 31.5–35.7)
MCV: 95 fL (ref 79–97)
MONOS ABS: 0.5 10*3/uL (ref 0.1–0.9)
Monocytes: 10 %
NEUTROS PCT: 39 %
Neutrophils Absolute: 1.8 10*3/uL (ref 1.4–7.0)
PLATELETS: 286 10*3/uL (ref 150–450)
RBC: 4.26 x10E6/uL (ref 3.77–5.28)
RDW: 12.5 % (ref 12.3–15.4)
WBC: 4.6 10*3/uL (ref 3.4–10.8)

## 2018-04-14 LAB — COMPREHENSIVE METABOLIC PANEL
A/G RATIO: 1.9 (ref 1.2–2.2)
ALBUMIN: 4.6 g/dL (ref 3.6–4.8)
ALT: 22 IU/L (ref 0–32)
AST: 20 IU/L (ref 0–40)
Alkaline Phosphatase: 47 IU/L (ref 39–117)
BILIRUBIN TOTAL: 0.4 mg/dL (ref 0.0–1.2)
BUN / CREAT RATIO: 14 (ref 12–28)
BUN: 9 mg/dL (ref 8–27)
CHLORIDE: 102 mmol/L (ref 96–106)
CO2: 24 mmol/L (ref 20–29)
Calcium: 10.2 mg/dL (ref 8.7–10.3)
Creatinine, Ser: 0.66 mg/dL (ref 0.57–1.00)
GFR calc non Af Amer: 96 mL/min/{1.73_m2} (ref >59)
GFR, EST AFRICAN AMERICAN: 111 mL/min/{1.73_m2} (ref >59)
Globulin, Total: 2.4 g/dL (ref 1.5–4.5)
Glucose: 106 mg/dL — ABNORMAL HIGH (ref 65–99)
POTASSIUM: 4.7 mmol/L (ref 3.5–5.2)
Sodium: 140 mmol/L (ref 134–144)
TOTAL PROTEIN: 7 g/dL (ref 6.0–8.5)

## 2018-04-14 LAB — LIPID PANEL
Chol/HDL Ratio: 5 ratio — ABNORMAL HIGH (ref 0.0–4.4)
Cholesterol, Total: 201 mg/dL — ABNORMAL HIGH (ref 100–199)
HDL: 40 mg/dL (ref >39)
LDL Calculated: 144 mg/dL — ABNORMAL HIGH (ref 0–99)
Triglycerides: 84 mg/dL (ref 0–149)
VLDL Cholesterol Cal: 17 mg/dL (ref 5–40)

## 2018-04-14 LAB — TSH: TSH: 1.27 u[IU]/mL (ref 0.450–4.500)

## 2018-04-21 ENCOUNTER — Ambulatory Visit
Admission: RE | Admit: 2018-04-21 | Discharge: 2018-04-21 | Disposition: A | Discharge status: Home / Self Care | Payer: Managed Care, Other (non HMO) | Source: Ambulatory Visit | Attending: Internal Medicine | Admitting: Internal Medicine

## 2018-04-21 DIAGNOSIS — Z1231 Encounter for screening mammogram for malignant neoplasm of breast: Secondary | ICD-10-CM | POA: Diagnosis present

## 2018-04-22 ENCOUNTER — Other Ambulatory Visit: Payer: Self-pay

## 2018-04-22 ENCOUNTER — Encounter: Payer: Self-pay | Admitting: *Deleted

## 2018-04-22 LAB — HGB A1C W/O EAG: Hgb A1c MFr Bld: 6.2 % — ABNORMAL HIGH (ref 4.8–5.6)

## 2018-04-22 LAB — SPECIMEN STATUS REPORT

## 2018-04-27 ENCOUNTER — Other Ambulatory Visit: Payer: Self-pay | Admitting: Internal Medicine

## 2018-04-27 DIAGNOSIS — E782 Mixed hyperlipidemia: Secondary | ICD-10-CM

## 2018-04-27 DIAGNOSIS — R7303 Prediabetes: Secondary | ICD-10-CM

## 2018-04-27 MED ORDER — ROSUVASTATIN CALCIUM 10 MG PO TABS: 10.0000 mg | ORAL_TABLET | Freq: Every day | ORAL | 1 refills | Status: DC

## 2018-04-27 NOTE — Progress Notes (Signed)
Patient would like cholesterol meds sent to Pharm [facility] in [place]

## 2018-04-28 NOTE — Discharge Instructions (Signed)
General Anesthesia, Adult, Care After °These instructions provide you with information about caring for yourself after your procedure. Your health care provider may also give you more specific instructions. Your treatment has been planned according to current medical practices, but problems sometimes occur. Call your health care provider if you have any problems or questions after your procedure. °What can I expect after the procedure? °After the procedure, it is common to have: °· Vomiting. °· A sore throat. °· Mental slowness. ° °It is common to feel: °· Nauseous. °· Cold or shivery. °· Sleepy. °· Tired. °· Sore or achy, even in parts of your body where you did not have surgery. ° °Follow these instructions at home: °For at least 24 hours after the procedure: °· Do not: °? Participate in activities where you could fall or become injured. °? Drive. °? Use heavy machinery. °? Drink alcohol. °? Take sleeping pills or medicines that cause drowsiness. °? Make important decisions or sign legal documents. °? Take care of children on your own. °· Rest. °Eating and drinking °· If you vomit, drink water, juice, or soup when you can drink without vomiting. °· Drink enough fluid to keep your urine clear or pale yellow. °· Make sure you have little or no nausea before eating solid foods. °· Follow the diet recommended by your health care provider. °General instructions °· Have a responsible adult stay with you until you are awake and alert. °· Return to your normal activities as told by your health care provider. Ask your health care provider what activities are safe for you. °· Take over-the-counter and prescription medicines only as told by your health care provider. °· If you smoke, do not smoke without supervision. °· Keep all follow-up visits as told by your health care provider. This is important. °Contact a health care provider if: °· You continue to have nausea or vomiting at home, and medicines are not helpful. °· You  cannot drink fluids or start eating again. °· You cannot urinate after 8-12 hours. °· You develop a skin rash. °· You have fever. °· You have increasing redness at the site of your procedure. °Get help right away if: °· You have difficulty breathing. °· You have chest pain. °· You have unexpected bleeding. °· You feel that you are having a life-threatening or urgent problem. °This information is not intended to replace advice given to you by your health care provider. Make sure you discuss any questions you have with your health care provider. °Document Released: 09/22/2000 Document Revised: 11/19/2015 Document Reviewed: 05/31/2015 °Elsevier Interactive Patient Education © 2018 [employer] ° °

## 2018-04-29 ENCOUNTER — Ambulatory Visit: Payer: Managed Care, Other (non HMO) | Admitting: Anesthesiology

## 2018-04-29 ENCOUNTER — Ambulatory Visit
Admission: RE | Admit: 2018-04-29 | Discharge: 2018-04-29 | Disposition: A | Discharge status: Home / Self Care | Payer: Managed Care, Other (non HMO) | Source: Ambulatory Visit | Attending: Gastroenterology | Admitting: Gastroenterology

## 2018-04-29 ENCOUNTER — Encounter
Admission: RE | Disposition: A | Discharge status: Home / Self Care | Payer: Self-pay | Source: Ambulatory Visit | Attending: Gastroenterology

## 2018-04-29 DIAGNOSIS — Z7982 Long term (current) use of aspirin: Secondary | ICD-10-CM | POA: Insufficient documentation

## 2018-04-29 DIAGNOSIS — K641 Second degree hemorrhoids: Secondary | ICD-10-CM | POA: Diagnosis not present

## 2018-04-29 DIAGNOSIS — Z79899 Other long term (current) drug therapy: Secondary | ICD-10-CM | POA: Insufficient documentation

## 2018-04-29 DIAGNOSIS — E7800 Pure hypercholesterolemia, unspecified: Secondary | ICD-10-CM | POA: Insufficient documentation

## 2018-04-29 DIAGNOSIS — I10 Essential (primary) hypertension: Secondary | ICD-10-CM | POA: Insufficient documentation

## 2018-04-29 DIAGNOSIS — Z1211 Encounter for screening for malignant neoplasm of colon: Secondary | ICD-10-CM

## 2018-04-29 DIAGNOSIS — D126 Benign neoplasm of colon, unspecified: Secondary | ICD-10-CM

## 2018-04-29 DIAGNOSIS — D123 Benign neoplasm of transverse colon: Secondary | ICD-10-CM | POA: Diagnosis not present

## 2018-04-29 DIAGNOSIS — D125 Benign neoplasm of sigmoid colon: Secondary | ICD-10-CM | POA: Diagnosis not present

## 2018-04-29 DIAGNOSIS — K219 Gastro-esophageal reflux disease without esophagitis: Secondary | ICD-10-CM | POA: Diagnosis not present

## 2018-04-29 HISTORY — PX: POLYPECTOMY: SHX5525

## 2018-04-29 HISTORY — PX: COLONOSCOPY WITH PROPOFOL: SHX5780

## 2018-04-29 SURGERY — COLONOSCOPY WITH PROPOFOL
Anesthesia: General | Site: Rectum

## 2018-04-29 MED ORDER — STERILE WATER FOR IRRIGATION IR SOLN
Status: DC | PRN
  Administered 2018-04-29: 11:00:00

## 2018-04-29 MED ORDER — LIDOCAINE HCL (CARDIAC) PF 100 MG/5ML IV SOSY
PREFILLED_SYRINGE | INTRAVENOUS | Status: DC | PRN
  Administered 2018-04-29: 40 mg via INTRAVENOUS

## 2018-04-29 MED ORDER — PROPOFOL 10 MG/ML IV BOLUS
INTRAVENOUS | Status: DC | PRN
  Administered 2018-04-29: 100 mg via INTRAVENOUS
  Administered 2018-04-29 (×2): 20 mg via INTRAVENOUS
  Administered 2018-04-29: 40 mg via INTRAVENOUS
  Administered 2018-04-29 (×9): 20 mg via INTRAVENOUS

## 2018-04-29 MED ORDER — LACTATED RINGERS IV SOLN
INTRAVENOUS | Status: DC
  Administered 2018-04-29: 11:00:00 via INTRAVENOUS

## 2018-04-29 SURGICAL SUPPLY — 6 items
CANISTER SUCT 1200ML W/VALVE (MISCELLANEOUS) ×3 IMPLANT
FORCEPS BIOP RAD 4 LRG CAP 4 (CUTTING FORCEPS) ×3 IMPLANT
GOWN CVR UNV OPN BCK APRN NK (MISCELLANEOUS) ×2 IMPLANT
GOWN ISOL THUMB LOOP REG UNIV (MISCELLANEOUS) ×4
KIT ENDO PROCEDURE OLY (KITS) ×3 IMPLANT
WATER STERILE IRR 250ML POUR (IV SOLUTION) ×3 IMPLANT

## 2018-04-29 NOTE — Anesthesia Procedure Notes (Signed)
Procedure Name: MAC Date/Time: 04/29/2018 10:55 AM Performed by: Janna Arch, CRNA Pre-anesthesia Checklist: Patient identified, Emergency Drugs available, Suction available, Timeout performed and Patient being monitored Patient Re-evaluated:Patient Re-evaluated prior to induction Oxygen Delivery Method: Nasal cannula Placement Confirmation: positive ETCO2

## 2018-04-29 NOTE — Transfer of Care (Signed)
Immediate Anesthesia Transfer of Care Note  Patient: Molly Kelly  Procedure(s) Performed: COLONOSCOPY WITH BIOPSY (N/A Rectum) POLYPECTOMY (N/A Rectum)  Patient Location: PACU  Anesthesia Type: General  Level of Consciousness: awake, alert  and patient cooperative  Airway and Oxygen Therapy: Patient Spontanous Breathing and Patient connected to supplemental oxygen  Post-op Assessment: Post-op Vital signs reviewed, Patient's Cardiovascular Status Stable, Respiratory Function Stable, Patent Airway and No signs of Nausea or vomiting  Post-op Vital Signs: Reviewed and stable  Complications: No apparent anesthesia complications

## 2018-04-29 NOTE — Op Note (Signed)
[facility] Patient Name: Molly Kelly Procedure Date: 04/29/2018 10:52 AM MRN: [id number] Account #: [id number] Date of Birth: 11/06/1955 Admit Type: Outpatient Age: 62 Room: [facility] OR ROOM 01 Gender: Female Note Status: Finalized Procedure:            Colonoscopy Indications:          Screening for colorectal malignant neoplasm Providers:            [name] MD, MD Referring MD:         Halina Maidens, MD (Referring MD) Medicines:            Propofol per Anesthesia Complications:        No immediate complications. Procedure:            Pre-Anesthesia Assessment:                       - Prior to the procedure, a History and Physical was                        performed, and patient medications and allergies were                        reviewed. The patient's tolerance of previous                        anesthesia was also reviewed. The risks and benefits of                        the procedure and the sedation options and risks were                        discussed with the patient. All questions were                        answered, and informed consent was obtained. Prior                        Anticoagulants: The patient has taken no previous                        anticoagulant or antiplatelet agents. ASA Grade                        Assessment: II - A patient with mild systemic disease.                        After reviewing the risks and benefits, the patient was                        deemed in satisfactory condition to undergo the                        procedure.                       After obtaining informed consent, the colonoscope was                        passed under direct vision. Throughout the procedure,  the patient's blood pressure, pulse, and oxygen                        saturations were monitored continuously. The was                        introduced through the anus and advanced to the the                     cecum, identified by appendiceal orifice and ileocecal                        valve. The colonoscopy was performed without                        difficulty. The patient tolerated the procedure well.                        The quality of the bowel preparation was excellent. Findings:      The perianal and digital rectal examinations were normal.      Two sessile polyps were found in the sigmoid colon. The polyps were 3 to       4 mm in size. These polyps were removed with a cold biopsy forceps.       Resection and retrieval were complete.      A 3 mm polyp was found in the transverse colon. The polyp was sessile.       The polyp was removed with a cold biopsy forceps. Resection and       retrieval were complete.      Non-bleeding internal hemorrhoids were found during retroflexion. The       hemorrhoids were Grade II (internal hemorrhoids that prolapse but reduce       spontaneously). Impression:           - Two 3 to 4 mm polyps in the sigmoid colon, removed                        with a cold biopsy forceps. Resected and retrieved.                       - One 3 mm polyp in the transverse colon, removed with                        a cold biopsy forceps. Resected and retrieved.                       - Non-bleeding internal hemorrhoids. Recommendation:       - Discharge patient to home.                       - Resume previous diet.                       - Continue present medications.                       - Await pathology results.                       - Repeat colonoscopy in 5 years if polyp adenoma and 10  years if hyperplastic Procedure Code(s):    --- Professional ---                       [phone], Colonoscopy, flexible; with biopsy, single or                        multiple Diagnosis Code(s):    --- Professional ---                       Z12.11, Encounter for screening for malignant neoplasm                        of colon                        D12.5, Benign neoplasm of sigmoid colon                       D12.3, Benign neoplasm of transverse colon (hepatic                        flexure or splenic flexure) CPT copyright 2018 American Medical Association. All rights reserved. The codes documented in this report are preliminary and upon coder review may  be revised to meet current compliance requirements. [name] MD, MD 04/29/2018 11:22:42 AM This report has been signed electronically. Number of Addenda: 0 Note Initiated On: 04/29/2018 10:52 AM Scope Withdrawal Time: 0 hours 7 minutes 22 seconds  Total Procedure Duration: 0 hours 20 minutes 53 seconds       [facility]

## 2018-04-29 NOTE — H&P (Signed)
Molly Lame, MD [facility] [address] [place], [place] [address] Phone: [phone] Fax : [phone]  Primary Care Physician:  Glean Hess, MD Primary Gastroenterologist:  Dr. Allen Norris  Pre-Procedure History & Physical: HPI:  Molly Kelly is a 62 y.o. female is here for a screening colonoscopy.   Past Medical History:  Diagnosis Date  . Acid reflux   . Hypercholesteremia   . Hypertension     Past Surgical History:  Procedure Laterality Date  . CARPAL TUNNEL RELEASE Bilateral    right - 14 yrs ago.  left - 06/2017    Prior to Admission medications   Medication Sig Start Date End Date Taking? Authorizing Provider  aspirin 81 MG tablet Take 1 tablet by mouth daily.   Yes [provider]  enalapril (VASOTEC) 10 MG tablet Take 1 tablet (10 mg total) by mouth daily. 04/13/18  Yes Glean Hess, MD  Multiple Vitamins-Minerals (MULTIVITAMIN WITH MINERALS) tablet Take 1 tablet by mouth daily.   Yes [provider]  rosuvastatin (CRESTOR) 10 MG tablet Take 1 tablet (10 mg total) by mouth daily. 04/27/18  Yes Glean Hess, MD  spironolactone (ALDACTONE) 25 MG tablet Take 1 tablet (25 mg total) by mouth daily. 04/13/18  Yes Glean Hess, MD  ranitidine (ZANTAC) 300 MG tablet Take 1 tablet (300 mg total) by mouth at bedtime. Patient not taking: Reported on 04/22/2018 04/13/18   Glean Hess, MD    Allergies as of 04/13/2018  . (No Known Allergies)    Family History  Problem Relation Age of Onset  . Hypertension Mother   . Uterine cancer Mother   . CAD Brother   . Breast cancer Cousin        mat cousin    Social History   Socioeconomic History  . Marital status: Single    Spouse name: Not on file  . Number of children: Not on file  . Years of education: Not on file  . Highest education level: Not on file  Occupational History  . Not on file  Social Needs  . Financial resource strain: Not on file  . Food insecurity:     Worry: Not on file    Inability: Not on file  . Transportation needs:    Medical: Not on file    Non-medical: Not on file  Tobacco Use  . Smoking status: Never Smoker  . Smokeless tobacco: Never Used  Substance and Sexual Activity  . Alcohol use: Yes    Comment: socially  . Drug use: No  . Sexual activity: Not on file  Lifestyle  . Physical activity:    Days per week: Not on file    Minutes per session: Not on file  . Stress: Not on file  Relationships  . Social connections:    Talks on phone: Not on file    Gets together: Not on file    Attends religious service: Not on file    Active member of club or organization: Not on file    Attends meetings of clubs or organizations: Not on file    Relationship status: Not on file  . Intimate partner violence:    Fear of current or ex partner: Not on file    Emotionally abused: Not on file    Physically abused: Not on file    Forced sexual activity: Not on file  Other Topics Concern  . Not on file  Social History Narrative  . Not  on file    Review of Systems: See HPI, otherwise negative ROS  Physical Exam: Ht 5\' 5"  (1.651 m)   Wt 104.8 kg   BMI 38.44 kg/m  General:   Alert,  pleasant and cooperative in NAD Head:  Normocephalic and atraumatic. Neck:  Supple; no masses or thyromegaly. Lungs:  Clear throughout to auscultation.    Heart:  Regular rate and rhythm. Abdomen:  Soft, nontender and nondistended. Normal bowel sounds, without guarding, and without rebound.   Neurologic:  Alert and  oriented x4;  grossly normal neurologically.  Impression/Plan: Molly Kelly is now here to undergo a screening colonoscopy.  Risks, benefits, and alternatives regarding colonoscopy have been reviewed with the patient.  Questions have been answered.  All parties agreeable.

## 2018-04-29 NOTE — Anesthesia Preprocedure Evaluation (Signed)
Anesthesia Evaluation  Patient identified by MRN, date of birth, ID band Patient awake    Reviewed: Allergy & Precautions, H&P , NPO status , Patient's Chart, lab work & pertinent test results, reviewed documented beta blocker date and time   Airway Mallampati: II  TM Distance: >3 FB Neck ROM: full    Dental no notable dental hx.    Pulmonary neg pulmonary ROS,    Pulmonary exam normal breath sounds clear to auscultation       Cardiovascular Exercise Tolerance: Good hypertension, + CAD   Rhythm:regular Rate:Normal     Neuro/Psych negative neurological ROS  negative psych ROS   GI/Hepatic Neg liver ROS, GERD  ,  Endo/Other  negative endocrine ROS  Renal/GU negative Renal ROS  negative genitourinary   Musculoskeletal   Abdominal   Peds  Hematology negative hematology ROS (+)   Anesthesia Other Findings   Reproductive/Obstetrics negative OB ROS                             Anesthesia Physical Anesthesia Plan  ASA: II  Anesthesia Plan: General   Post-op Pain Management:    Induction:   PONV Risk Score and Plan:   Airway Management Planned:   Additional Equipment:   Intra-op Plan:   Post-operative Plan:   Informed Consent: I have reviewed the patients History and Physical, chart, labs and discussed the procedure including the risks, benefits and alternatives for the proposed anesthesia with the patient or authorized representative who has indicated his/her understanding and acceptance.   Dental Advisory Given  Plan Discussed with: CRNA  Anesthesia Plan Comments:         Anesthesia Quick Evaluation

## 2018-04-29 NOTE — Anesthesia Postprocedure Evaluation (Signed)
Anesthesia Post Note  Patient: Molly Kelly  Procedure(s) Performed: COLONOSCOPY WITH BIOPSY (N/A Rectum) POLYPECTOMY (N/A Rectum)  Patient location during evaluation: PACU Anesthesia Type: General Level of consciousness: awake and alert Pain management: pain level controlled Vital Signs Assessment: post-procedure vital signs reviewed and stable Respiratory status: spontaneous breathing, nonlabored ventilation, respiratory function stable and patient connected to nasal cannula oxygen Cardiovascular status: blood pressure returned to baseline and stable Postop Assessment: no apparent nausea or vomiting Anesthetic complications: no    Alisa Graff

## 2018-05-03 ENCOUNTER — Encounter: Payer: Self-pay | Admitting: Gastroenterology

## 2018-05-03 ENCOUNTER — Encounter: Payer: Self-pay | Admitting: Internal Medicine

## 2018-08-11 ENCOUNTER — Other Ambulatory Visit: Payer: Self-pay

## 2018-08-11 ENCOUNTER — Ambulatory Visit: Payer: Managed Care, Other (non HMO) | Admitting: Internal Medicine

## 2018-08-11 ENCOUNTER — Encounter: Payer: Self-pay | Admitting: Internal Medicine

## 2018-08-11 VITALS — BP 130/86 | HR 82 | Ht 65.0 in | Wt 232.0 lb

## 2018-08-11 DIAGNOSIS — E782 Mixed hyperlipidemia: Secondary | ICD-10-CM | POA: Diagnosis not present

## 2018-08-11 DIAGNOSIS — R7303 Prediabetes: Secondary | ICD-10-CM

## 2018-08-11 DIAGNOSIS — L299 Pruritus, unspecified: Secondary | ICD-10-CM | POA: Diagnosis not present

## 2018-08-11 DIAGNOSIS — E1169 Type 2 diabetes mellitus with other specified complication: Secondary | ICD-10-CM | POA: Insufficient documentation

## 2018-08-11 DIAGNOSIS — I10 Essential (primary) hypertension: Secondary | ICD-10-CM | POA: Diagnosis not present

## 2018-08-11 NOTE — Progress Notes (Signed)
Date:  08/11/2018   Name:  Molly Kelly   DOB:  09-04-1955   MRN:  [id number]   Chief Complaint: Hyperlipidemia and Hypertension  Hypertension  This is a chronic problem. The problem is controlled. Pertinent negatives include no chest pain, headaches, palpitations or shortness of breath. Past treatments include ACE inhibitors and diuretics. The current treatment provides significant improvement. There are no compliance problems.   Hyperlipidemia  This is a chronic problem. The problem is uncontrolled. Pertinent negatives include no chest pain or shortness of breath. Current antihyperlipidemic treatment includes statins (changed to crestor).  Diabetes  She presents for her follow-up diabetic visit. Diabetes type: pre-diabetes. Pertinent negatives for hypoglycemia include no dizziness or headaches. Pertinent negatives for diabetes include no chest pain and no fatigue. Her weight is stable. She is following a generally healthy (she has been limiting carbs and exercising regularly) diet. An ACE inhibitor/angiotensin II receptor blocker is being taken.  Itching -  Intermittent in various sites with no rash or known exposure.  She has not tried any treatment.  Lab Results  Component Value Date   HGBA1C 6.2 (H) 04/13/2018   Lab Results  Component Value Date   CHOL 201 (H) 04/13/2018   HDL 40 04/13/2018   LDLCALC 144 (H) 04/13/2018   TRIG 84 04/13/2018   CHOLHDL 5.0 (H) 04/13/2018      Review of Systems  Constitutional: Negative for chills, fatigue, fever and unexpected weight change.  Respiratory: Negative for cough, chest tightness, shortness of breath and wheezing.   Cardiovascular: Negative for chest pain and palpitations.  Gastrointestinal: Negative for abdominal pain, constipation and diarrhea.  Genitourinary: Negative for dysuria, frequency and urgency.  Skin: Negative for rash (itching).  Neurological: Negative for dizziness and headaches.  Psychiatric/Behavioral:  Negative for sleep disturbance.    Patient Active Problem List   Diagnosis Date Noted  . Mixed hyperlipidemia 08/11/2018  . Special screening for malignant neoplasms, colon   . Benign neoplasm of transverse colon   . Tubular adenoma of colon   . Pre-diabetes 04/27/2018  . Coronary artery disease involving native heart 05/02/2015  . Gastroesophageal reflux disease 05/02/2015  . Benign hypertension 05/02/2015    No Known Allergies  Past Surgical History:  Procedure Laterality Date  . CARPAL TUNNEL RELEASE Bilateral    right - 14 yrs ago.  left - 06/2017  . COLONOSCOPY WITH PROPOFOL N/A 04/29/2018   Procedure: COLONOSCOPY WITH BIOPSY;  Surgeon: [name], MD;  Location: [place];  Service: Endoscopy;  Laterality: N/A;  . POLYPECTOMY N/A 04/29/2018   Procedure: POLYPECTOMY;  Surgeon: [name], MD;  Location: [place];  Service: Endoscopy;  Laterality: N/A;    Social History   Tobacco Use  . Smoking status: Never Smoker  . Smokeless tobacco: Never Used  Substance Use Topics  . Alcohol use: Yes    Comment: socially  . Drug use: No     Medication list has been reviewed and updated.  Current Meds  Medication Sig  . aspirin 81 MG tablet Take 1 tablet by mouth daily.  . enalapril (VASOTEC) 10 MG tablet Take 1 tablet (10 mg total) by mouth daily.  . Multiple Vitamins-Minerals (MULTIVITAMIN WITH MINERALS) tablet Take 1 tablet by mouth daily.  . ranitidine (ZANTAC) 300 MG tablet Take 1 tablet (300 mg total) by mouth at bedtime.  . rosuvastatin (CRESTOR) 10 MG tablet Take 1 tablet (10 mg total) by mouth daily.  Marland Kitchen spironolactone (ALDACTONE) 25 MG tablet  Take 1 tablet (25 mg total) by mouth daily.    PHQ 2/9 Scores 08/11/2018 04/13/2018 04/10/2017 02/26/2016  PHQ - 2 Score 0 0 0 0   Wt Readings from Last 3 Encounters:  08/11/18 232 lb (105.2 kg)  04/29/18 226 lb (102.5 kg)  04/13/18 231 lb (104.8 kg)     Physical Exam Vitals signs and nursing  note reviewed.  Constitutional:      General: She is not in acute distress.    Appearance: She is well-developed.  HENT:     Head: Normocephalic and atraumatic.     Mouth/Throat:     Mouth: Mucous membranes are moist.  Neck:     Musculoskeletal: Normal range of motion and neck supple.  Cardiovascular:     Rate and Rhythm: Normal rate and regular rhythm.     Pulses: Normal pulses.     Heart sounds: Normal heart sounds.  Pulmonary:     Effort: Pulmonary effort is normal. No respiratory distress.     Breath sounds: Normal breath sounds. No wheezing or rhonchi.  Musculoskeletal: Normal range of motion.  Lymphadenopathy:     Cervical: No cervical adenopathy.  Skin:    General: Skin is warm and dry.     Findings: No erythema or rash.       Neurological:     Mental Status: She is alert and oriented to person, place, and time.  Psychiatric:        Behavior: Behavior normal.        Thought Content: Thought content normal.     BP 130/86   Pulse 82   Ht 5\' 5"  (1.651 m)   Wt 232 lb (105.2 kg)   SpO2 98%   BMI 38.61 kg/m   Assessment and Plan: 1. Benign hypertension controlled  2. Pre-diabetes Continue low carb diet, exercise and efforts at weight loss - Hemoglobin A1c  3. Mixed hyperlipidemia Now on crestory - Comprehensive metabolic panel - Lipid panel  4. Pruritus Recommend consistent brands of personal care products and laundry detergent Begin Claritin or Zyrtec Consider trial off of crestor since this is the only new medication   Partially dictated using Editor, commissioning. Any errors are unintentional.  Halina Maidens, MD [facility]  08/11/2018

## 2018-08-12 LAB — HEMOGLOBIN A1C
Est. average glucose Bld gHb Est-mCnc: 134 mg/dL
Hgb A1c MFr Bld: 6.3 % — ABNORMAL HIGH (ref 4.8–5.6)

## 2018-08-12 LAB — COMPREHENSIVE METABOLIC PANEL
A/G RATIO: 2 (ref 1.2–2.2)
ALT: 21 IU/L (ref 0–32)
AST: 17 IU/L (ref 0–40)
Albumin: 4.9 g/dL (ref 3.8–4.9)
Alkaline Phosphatase: 61 IU/L (ref 39–117)
BILIRUBIN TOTAL: 0.3 mg/dL (ref 0.0–1.2)
BUN / CREAT RATIO: 19 (ref 12–28)
BUN: 14 mg/dL (ref 8–27)
CO2: 22 mmol/L (ref 20–29)
CREATININE: 0.72 mg/dL (ref 0.57–1.00)
Calcium: 10.8 mg/dL — ABNORMAL HIGH (ref 8.7–10.3)
Chloride: 101 mmol/L (ref 96–106)
GFR calc non Af Amer: 91 mL/min/{1.73_m2} (ref >59)
GFR, EST AFRICAN AMERICAN: 105 mL/min/{1.73_m2} (ref >59)
GLOBULIN, TOTAL: 2.5 g/dL (ref 1.5–4.5)
Glucose: 108 mg/dL — ABNORMAL HIGH (ref 65–99)
Potassium: 5 mmol/L (ref 3.5–5.2)
Sodium: 140 mmol/L (ref 134–144)
TOTAL PROTEIN: 7.4 g/dL (ref 6.0–8.5)

## 2018-08-12 LAB — LIPID PANEL
CHOL/HDL RATIO: 4.2 ratio (ref 0.0–4.4)
CHOLESTEROL TOTAL: 212 mg/dL — AB (ref 100–199)
HDL: 50 mg/dL (ref >39)
LDL Calculated: 147 mg/dL — ABNORMAL HIGH (ref 0–99)
Triglycerides: 74 mg/dL (ref 0–149)
VLDL CHOLESTEROL CAL: 15 mg/dL (ref 5–40)

## 2018-10-20 ENCOUNTER — Other Ambulatory Visit: Payer: Self-pay | Admitting: Internal Medicine

## 2018-10-20 DIAGNOSIS — E782 Mixed hyperlipidemia: Secondary | ICD-10-CM

## 2018-11-25 ENCOUNTER — Telehealth: Payer: Self-pay

## 2018-11-25 NOTE — Telephone Encounter (Signed)
Error

## 2018-12-10 ENCOUNTER — Other Ambulatory Visit: Payer: Self-pay | Admitting: Internal Medicine

## 2018-12-10 ENCOUNTER — Other Ambulatory Visit: Payer: Self-pay

## 2018-12-10 ENCOUNTER — Encounter: Payer: Self-pay | Admitting: Internal Medicine

## 2018-12-10 ENCOUNTER — Ambulatory Visit: Payer: Managed Care, Other (non HMO) | Admitting: Internal Medicine

## 2018-12-10 VITALS — BP 121/82 | HR 77 | Resp 16 | Ht 65.0 in | Wt 234.0 lb

## 2018-12-10 DIAGNOSIS — Z202 Contact with and (suspected) exposure to infections with a predominantly sexual mode of transmission: Secondary | ICD-10-CM

## 2018-12-10 DIAGNOSIS — R7303 Prediabetes: Secondary | ICD-10-CM | POA: Diagnosis not present

## 2018-12-10 DIAGNOSIS — I10 Essential (primary) hypertension: Secondary | ICD-10-CM | POA: Diagnosis not present

## 2018-12-10 DIAGNOSIS — R300 Dysuria: Secondary | ICD-10-CM

## 2018-12-10 DIAGNOSIS — Z23 Encounter for immunization: Secondary | ICD-10-CM

## 2018-12-10 LAB — POCT URINALYSIS DIPSTICK
Bilirubin, UA: NEGATIVE
Blood, UA: NEGATIVE
Glucose, UA: NEGATIVE
Ketones, UA: NEGATIVE
Leukocytes, UA: NEGATIVE
Nitrite, UA: NEGATIVE
Protein, UA: NEGATIVE
Spec Grav, UA: 1.01 (ref 1.010–1.025)
Urobilinogen, UA: 0.2 E.U./dL
pH, UA: 6 (ref 5.0–8.0)

## 2018-12-10 NOTE — Progress Notes (Signed)
Date:  12/10/2018   Name:  Molly Kelly   DOB:  1956-05-07   MRN:  [id number]   Chief Complaint: Hypertension (follow up) and Exposure to STD (Boyfriend cheated and she is afraid she may have been exposed to STD. Having some dysuria off and on for last few days. )  Hypertension This is a chronic problem. The problem is controlled. Pertinent negatives include no chest pain, headaches, palpitations or shortness of breath.  Exposure to STD  The patient's primary symptoms include dysuria. Pertinent negatives include no abdominal pain or fever.  Dysuria  Pertinent negatives include no chills or nausea.  Diabetes She presents for her follow-up diabetic visit. Diabetes type: prediabetes. Her disease course has been stable. Pertinent negatives for hypoglycemia include no dizziness or headaches. Pertinent negatives for diabetes include no chest pain and no fatigue.  Possible STD exposure - she found out a month ago that her boyfriend of three years cheated on her.  She denies concern for HIV but does have the intermittent dysuria as above and would like GC and chlamydia tests.  Lab Results  Component Value Date   HGBA1C 6.3 (H) 08/11/2018    Review of Systems  Constitutional: Negative for chills, fatigue and fever.  Respiratory: Negative for cough, chest tightness, shortness of breath and wheezing.   Cardiovascular: Negative for chest pain, palpitations and leg swelling.  Gastrointestinal: Negative for abdominal pain, diarrhea and nausea.  Genitourinary: Positive for dysuria. Negative for difficulty urinating, genital sores, menstrual problem and vaginal discharge.  Skin: Negative for rash.  Neurological: Negative for dizziness, light-headedness and headaches.  Psychiatric/Behavioral: Negative for dysphoric mood and sleep disturbance.    Patient Active Problem List   Diagnosis Date Noted  . Mixed hyperlipidemia 08/11/2018  . Special screening for malignant neoplasms, colon   .  Benign neoplasm of transverse colon   . Tubular adenoma of colon   . Pre-diabetes 04/27/2018  . Coronary artery disease involving native heart 05/02/2015  . Gastroesophageal reflux disease 05/02/2015  . Benign hypertension 05/02/2015    No Known Allergies  Past Surgical History:  Procedure Laterality Date  . CARPAL TUNNEL RELEASE Bilateral    right - 14 yrs ago.  left - 06/2017  . COLONOSCOPY WITH PROPOFOL N/A 04/29/2018   Procedure: COLONOSCOPY WITH BIOPSY;  Surgeon: [name], MD;  Location: [place];  Service: Endoscopy;  Laterality: N/A;  . POLYPECTOMY N/A 04/29/2018   Procedure: POLYPECTOMY;  Surgeon: [name], MD;  Location: [place];  Service: Endoscopy;  Laterality: N/A;    Social History   Tobacco Use  . Smoking status: Never Smoker  . Smokeless tobacco: Never Used  Substance Use Topics  . Alcohol use: Yes    Comment: socially  . Drug use: No     Medication list has been reviewed and updated.  Current Meds  Medication Sig  . aspirin 81 MG tablet Take 1 tablet by mouth daily.  . enalapril (VASOTEC) 10 MG tablet Take 1 tablet (10 mg total) by mouth daily.  . Multiple Vitamins-Minerals (MULTIVITAMIN WITH MINERALS) tablet Take 1 tablet by mouth daily.  . ranitidine (ZANTAC) 300 MG tablet Take 1 tablet (300 mg total) by mouth at bedtime.  . rosuvastatin (CRESTOR) 10 MG tablet TAKE 1 TABLET BY MOUTH EVERY DAY  . spironolactone (ALDACTONE) 25 MG tablet Take 1 tablet (25 mg total) by mouth daily.    PHQ 2/9 Scores 12/10/2018 08/11/2018 04/13/2018 04/10/2017  PHQ - 2 Score 0 0  0 0  PHQ- 9 Score 0 - - -    BP Readings from Last 3 Encounters:  12/10/18 121/82  08/11/18 130/86  04/29/18 95/69    Physical Exam Vitals signs and nursing note reviewed.  Constitutional:      General: She is not in acute distress.    Appearance: She is well-developed.  HENT:     Head: Normocephalic and atraumatic.  Neck:     Musculoskeletal: Normal  range of motion and neck supple.  Cardiovascular:     Rate and Rhythm: Normal rate and regular rhythm.     Pulses: Normal pulses.  Pulmonary:     Effort: Pulmonary effort is normal. No respiratory distress.  Musculoskeletal: Normal range of motion.     Right lower leg: No edema.     Left lower leg: No edema.  Lymphadenopathy:     Cervical: No cervical adenopathy.  Skin:    General: Skin is warm and dry.     Capillary Refill: Capillary refill takes less than 2 seconds.     Findings: No rash.  Neurological:     General: No focal deficit present.     Mental Status: She is alert and oriented to person, place, and time.  Psychiatric:        Behavior: Behavior normal.        Thought Content: Thought content normal.     Wt Readings from Last 3 Encounters:  12/10/18 234 lb (106.1 kg)  08/11/18 232 lb (105.2 kg)  04/29/18 226 lb (102.5 kg)    BP 121/82   Pulse 77   Resp 16   Ht 5\' 5"  (1.651 m)   Wt 234 lb (106.1 kg)   SpO2 98%   BMI 38.94 kg/m   Assessment and Plan: 1. Benign hypertension controlled  2. Pre-diabetes Check labs, continue to work on diet - Hemoglobin A1c  3. Possible exposure to STD - GC/Chlamydia Probe Amp([facility])  4. Dysuria UA negative - POCT urinalysis dipstick   Partially dictated using Editor, commissioning. Any errors are unintentional.  Halina Maidens, MD [facility]  12/10/2018

## 2018-12-11 LAB — HEMOGLOBIN A1C
Est. average glucose Bld gHb Est-mCnc: 134 mg/dL
Hgb A1c MFr Bld: 6.3 % — ABNORMAL HIGH (ref 4.8–5.6)

## 2018-12-16 LAB — GC/CHLAMYDIA PROBE AMP
Chlamydia trachomatis, NAA: NEGATIVE
Neisseria Gonorrhoeae by PCR: NEGATIVE

## 2019-03-25 ENCOUNTER — Telehealth: Payer: Self-pay

## 2019-03-25 NOTE — Telephone Encounter (Signed)
Patient called reporting elevated BP after spicy and salty foods for a few days. 2 days ago her BP was 141/93 and then later that same day it was 136/106.  She has not taken it since then and her BP cuff is old.   Told her to record her BP readings on paper. If her systolic is staying above [id number] and her diastolic over [id number] then told her to give me a call and we need to get her in soon. Otherwise, told her to see [place] at her scheduled appt in 3 weeks and to bring her BP readings and BP cuff. And also told her to avoid spicy and salty foods.  She verbalized understanding of this.

## 2019-04-15 ENCOUNTER — Encounter: Payer: Self-pay | Admitting: Internal Medicine

## 2019-04-15 ENCOUNTER — Ambulatory Visit (INDEPENDENT_AMBULATORY_CARE_PROVIDER_SITE_OTHER): Payer: Managed Care, Other (non HMO) | Admitting: Internal Medicine

## 2019-04-15 ENCOUNTER — Other Ambulatory Visit: Payer: Self-pay

## 2019-04-15 VITALS — BP 138/70 | HR 84 | Ht 65.0 in | Wt 231.0 lb

## 2019-04-15 DIAGNOSIS — Z0000 Encounter for general adult medical examination without abnormal findings: Secondary | ICD-10-CM

## 2019-04-15 DIAGNOSIS — Z1231 Encounter for screening mammogram for malignant neoplasm of breast: Secondary | ICD-10-CM | POA: Diagnosis not present

## 2019-04-15 DIAGNOSIS — I10 Essential (primary) hypertension: Secondary | ICD-10-CM | POA: Diagnosis not present

## 2019-04-15 DIAGNOSIS — E782 Mixed hyperlipidemia: Secondary | ICD-10-CM | POA: Diagnosis not present

## 2019-04-15 DIAGNOSIS — R7303 Prediabetes: Secondary | ICD-10-CM | POA: Diagnosis not present

## 2019-04-15 LAB — POCT URINALYSIS DIPSTICK
Bilirubin, UA: NEGATIVE
Blood, UA: NEGATIVE
Glucose, UA: NEGATIVE
Ketones, UA: NEGATIVE
Leukocytes, UA: NEGATIVE
Nitrite, UA: NEGATIVE
Protein, UA: NEGATIVE
Spec Grav, UA: 1.02 (ref 1.010–1.025)
Urobilinogen, UA: 0.2 E.U./dL
pH, UA: 6 (ref 5.0–8.0)

## 2019-04-15 MED ORDER — SPIRONOLACTONE 25 MG PO TABS: 25.0000 mg | ORAL_TABLET | Freq: Every day | ORAL | 3 refills | Status: DC

## 2019-04-15 MED ORDER — ENALAPRIL MALEATE 10 MG PO TABS: 10.0000 mg | ORAL_TABLET | Freq: Every day | ORAL | 3 refills | Status: DC

## 2019-04-15 MED ORDER — ROSUVASTATIN CALCIUM 10 MG PO TABS: 10.0000 mg | ORAL_TABLET | Freq: Every day | ORAL | 3 refills | Status: DC

## 2019-04-15 NOTE — Patient Instructions (Signed)
Stop Ranitidine  For recurrent heartburn symptoms - try Pepcid, Tagamet, Omeprazole

## 2019-04-15 NOTE — Progress Notes (Signed)
Date:  04/15/2019   Name:  Molly Kelly   DOB:  [birth year]   MRN:  [id number]   Chief Complaint: Annual Exam (Breast Exam. 2 more years til pap.) Molly Kelly is a 63 y.o. female who presents today for her Complete Annual Exam. She feels well. She reports exercising rarely. She reports she is sleeping fairly well. She denies breast issues.  She is due for a mammogram.  Mammogram 03/2018 Pap 2017 Colonoscopy 03/2018  Hypertension This is a chronic problem. The problem is controlled. Pertinent negatives include no chest pain, headaches, palpitations or shortness of breath. Risk factors for coronary artery disease include diabetes mellitus and obesity. Past treatments include ACE inhibitors and diuretics. There are no compliance problems (intermittently eats too much salt).   Diabetes She presents for her follow-up diabetic visit. Diabetes type: pre-diabetes. Pertinent negatives for hypoglycemia include no dizziness, headaches, nervousness/anxiousness or tremors. Pertinent negatives for diabetes include no chest pain, no fatigue, no polydipsia and no polyuria. Her weight is stable. She is following a generally healthy diet. An ACE inhibitor/angiotensin II receptor blocker is being taken.  Hyperlipidemia Pertinent negatives include no chest pain or shortness of breath.   Lab Results  Component Value Date   HGBA1C 6.3 (H) 12/10/2018   Lab Results  Component Value Date   CREATININE 0.72 08/11/2018   BUN 14 08/11/2018   NA 140 08/11/2018   K 5.0 08/11/2018   CL 101 08/11/2018   CO2 22 08/11/2018   Lab Results  Component Value Date   CHOL 212 (H) 08/11/2018   HDL 50 08/11/2018   LDLCALC 147 (H) 08/11/2018   TRIG 74 08/11/2018   CHOLHDL 4.2 08/11/2018     Review of Systems  Constitutional: Negative for chills, fatigue and fever.  HENT: Negative for congestion, hearing loss, tinnitus, trouble swallowing and voice change.   Eyes: Negative for visual disturbance.   Respiratory: Negative for cough, chest tightness, shortness of breath and wheezing.   Cardiovascular: Negative for chest pain, palpitations and leg swelling.  Gastrointestinal: Negative for abdominal pain, constipation, diarrhea and vomiting.  Endocrine: Negative for polydipsia and polyuria.  Genitourinary: Negative for dysuria, frequency, genital sores, vaginal bleeding and vaginal discharge.  Musculoskeletal: Negative for arthralgias, gait problem and joint swelling.  Skin: Negative for color change and rash.  Neurological: Negative for dizziness, tremors, light-headedness and headaches.  Hematological: Negative for adenopathy. Does not bruise/bleed easily.  Psychiatric/Behavioral: Negative for dysphoric mood and sleep disturbance. The patient is not nervous/anxious.     Patient Active Problem List   Diagnosis Date Noted  . Mixed hyperlipidemia 08/11/2018  . Special screening for malignant neoplasms, colon   . Benign neoplasm of transverse colon   . Tubular adenoma of colon   . Pre-diabetes 04/27/2018  . Coronary artery disease involving native heart 05/02/2015  . Gastroesophageal reflux disease 05/02/2015  . Benign hypertension 05/02/2015    No Known Allergies  Past Surgical History:  Procedure Laterality Date  . CARPAL TUNNEL RELEASE Bilateral    right - 14 yrs ago.  left - 06/2017  . COLONOSCOPY WITH PROPOFOL N/A 04/29/2018   Procedure: COLONOSCOPY WITH BIOPSY;  Surgeon: [name], MD;  Location: [place];  Service: Endoscopy;  Laterality: N/A;  . POLYPECTOMY N/A 04/29/2018   Procedure: POLYPECTOMY;  Surgeon: [name], MD;  Location: [place];  Service: Endoscopy;  Laterality: N/A;    Social History   Tobacco Use  . Smoking status: Never Smoker  .  Smokeless tobacco: Never Used  Substance Use Topics  . Alcohol use: Yes    Comment: socially  . Drug use: No     Medication list has been reviewed and updated.  Current Meds  Medication  Sig  . aspirin 81 MG tablet Take 1 tablet by mouth daily.  . enalapril (VASOTEC) 10 MG tablet Take 1 tablet (10 mg total) by mouth daily.  . Multiple Vitamins-Minerals (MULTIVITAMIN WITH MINERALS) tablet Take 1 tablet by mouth daily.  . rosuvastatin (CRESTOR) 10 MG tablet Take 1 tablet (10 mg total) by mouth daily.  Marland Kitchen spironolactone (ALDACTONE) 25 MG tablet Take 1 tablet (25 mg total) by mouth daily.  . [DISCONTINUED] enalapril (VASOTEC) 10 MG tablet Take 1 tablet (10 mg total) by mouth daily.  . [DISCONTINUED] rosuvastatin (CRESTOR) 10 MG tablet TAKE 1 TABLET BY MOUTH EVERY DAY  . [DISCONTINUED] spironolactone (ALDACTONE) 25 MG tablet Take 1 tablet (25 mg total) by mouth daily.    PHQ 2/9 Scores 04/15/2019 12/10/2018 08/11/2018 04/13/2018  PHQ - 2 Score 0 0 0 0  PHQ- 9 Score - 0 - -    BP Readings from Last 3 Encounters:  04/15/19 138/70  12/10/18 121/82  08/11/18 130/86    Physical Exam Vitals signs and nursing note reviewed.  Constitutional:      General: She is not in acute distress.    Appearance: She is well-developed.  HENT:     Head: Normocephalic and atraumatic.     Right Ear: Tympanic membrane and ear canal normal.     Left Ear: Tympanic membrane and ear canal normal.     Nose:     Right Sinus: No maxillary sinus tenderness.     Left Sinus: No maxillary sinus tenderness.  Eyes:     General: No scleral icterus.       Right eye: No discharge.        Left eye: No discharge.     Conjunctiva/sclera: Conjunctivae normal.  Neck:     Musculoskeletal: Normal range of motion. No erythema.     Thyroid: No thyromegaly.     Vascular: No carotid bruit.  Cardiovascular:     Rate and Rhythm: Normal rate and regular rhythm.     Pulses: Normal pulses.     Heart sounds: Normal heart sounds.  Pulmonary:     Effort: Pulmonary effort is normal. No respiratory distress.     Breath sounds: No wheezing.  Chest:     Breasts:        Right: No mass, nipple discharge, skin change or  tenderness.        Left: No mass, nipple discharge, skin change or tenderness.  Abdominal:     General: Bowel sounds are normal.     Palpations: Abdomen is soft.     Tenderness: There is no abdominal tenderness.  Musculoskeletal: Normal range of motion.  Lymphadenopathy:     Cervical: No cervical adenopathy.  Skin:    General: Skin is warm and dry.     Findings: No rash.  Neurological:     Mental Status: She is alert and oriented to person, place, and time.     Cranial Nerves: No cranial nerve deficit.     Sensory: No sensory deficit.     Deep Tendon Reflexes: Reflexes are normal and symmetric.  Psychiatric:        Speech: Speech normal.        Behavior: Behavior normal.        Thought Content:  Thought content normal.     Wt Readings from Last 3 Encounters:  04/15/19 231 lb (104.8 kg)  12/10/18 234 lb (106.1 kg)  08/11/18 232 lb (105.2 kg)    BP 138/70   Pulse 84   Ht 5\' 5"  (1.651 m)   Wt 231 lb (104.8 kg)   SpO2 98%   BMI 38.44 kg/m   Assessment and Plan: 1. Annual physical exam Normal exam except for weight Continue to exercise regularly; limit fats/carbs - POCT urinalysis dipstick  2. Encounter for screening mammogram for breast cancer Schedule at [place]; Future  3. Benign hypertension Clinically stable exam with well controlled BP.   Tolerating medications, enalapril 10 mg and aldactone 25 mg, without side effects at this time. Pt to continue current regimen and low sodium diet; benefits of regular exercise as able discussed. - enalapril (VASOTEC) 10 MG tablet; Take 1 tablet (10 mg total) by mouth daily.  Dispense: 90 tablet; Refill: 3 - spironolactone (ALDACTONE) 25 MG tablet; Take 1 tablet (25 mg total) by mouth daily.  Dispense: 90 tablet; Refill: 3 - CBC with Differential/Platelet - Comprehensive metabolic panel - TSH  4. Pre-diabetes Will check labs and advise Continue exercise, weight loss No medication unless A1C is  higher - Hemoglobin A1c  5. Mixed hyperlipidemia Tolerating statin medication without side effects at this time Continue same therapy without change at this time. - rosuvastatin (CRESTOR) 10 MG tablet; Take 1 tablet (10 mg total) by mouth daily.  Dispense: 90 tablet; Refill: 3 - Lipid panel   Partially dictated using Editor, commissioning. Any errors are unintentional.  Halina Maidens, MD [facility]  04/15/2019

## 2019-04-16 LAB — CBC WITH DIFFERENTIAL/PLATELET
Basophils Absolute: 0 10*3/uL (ref 0.0–0.2)
Basos: 1 %
EOS (ABSOLUTE): 0.1 10*3/uL (ref 0.0–0.4)
Eos: 2 %
Hematocrit: 39.1 % (ref 34.0–46.6)
Hemoglobin: 13.6 g/dL (ref 11.1–15.9)
Immature Grans (Abs): 0 10*3/uL (ref 0.0–0.1)
Immature Granulocytes: 0 %
Lymphocytes Absolute: 2 10*3/uL (ref 0.7–3.1)
Lymphs: 42 %
MCH: 32.4 pg (ref 26.6–33.0)
MCHC: 34.8 g/dL (ref 31.5–35.7)
MCV: 93 fL (ref 79–97)
Monocytes Absolute: 0.6 10*3/uL (ref 0.1–0.9)
Monocytes: 13 %
Neutrophils Absolute: 2 10*3/uL (ref 1.4–7.0)
Neutrophils: 42 %
Platelets: 279 10*3/uL (ref 150–450)
RBC: 4.2 x10E6/uL (ref 3.77–5.28)
RDW: 12.4 % (ref 11.7–15.4)
WBC: 4.7 10*3/uL (ref 3.4–10.8)

## 2019-04-16 LAB — LIPID PANEL
Chol/HDL Ratio: 4.6 ratio — ABNORMAL HIGH (ref 0.0–4.4)
Cholesterol, Total: 179 mg/dL (ref 100–199)
HDL: 39 mg/dL — ABNORMAL LOW (ref >39)
LDL Chol Calc (NIH): 127 mg/dL — ABNORMAL HIGH (ref 0–99)
Triglycerides: 67 mg/dL (ref 0–149)
VLDL Cholesterol Cal: 13 mg/dL (ref 5–40)

## 2019-04-16 LAB — COMPREHENSIVE METABOLIC PANEL
ALT: 21 IU/L (ref 0–32)
AST: 18 IU/L (ref 0–40)
Albumin/Globulin Ratio: 1.7 (ref 1.2–2.2)
Albumin: 4.6 g/dL (ref 3.8–4.8)
Alkaline Phosphatase: 63 IU/L (ref 39–117)
BUN/Creatinine Ratio: 16 (ref 12–28)
BUN: 12 mg/dL (ref 8–27)
Bilirubin Total: 0.4 mg/dL (ref 0.0–1.2)
CO2: 22 mmol/L (ref 20–29)
Calcium: 10.6 mg/dL — ABNORMAL HIGH (ref 8.7–10.3)
Chloride: 103 mmol/L (ref 96–106)
Creatinine, Ser: 0.74 mg/dL (ref 0.57–1.00)
GFR calc Af Amer: 101 mL/min/{1.73_m2} (ref >59)
GFR calc non Af Amer: 88 mL/min/{1.73_m2} (ref >59)
Globulin, Total: 2.7 g/dL (ref 1.5–4.5)
Glucose: 106 mg/dL — ABNORMAL HIGH (ref 65–99)
Potassium: 4.7 mmol/L (ref 3.5–5.2)
Sodium: 138 mmol/L (ref 134–144)
Total Protein: 7.3 g/dL (ref 6.0–8.5)

## 2019-04-16 LAB — HEMOGLOBIN A1C
Est. average glucose Bld gHb Est-mCnc: 146 mg/dL
Hgb A1c MFr Bld: 6.7 % — ABNORMAL HIGH (ref 4.8–5.6)

## 2019-04-16 LAB — TSH: TSH: 1.34 u[IU]/mL (ref 0.450–4.500)

## 2019-04-20 ENCOUNTER — Other Ambulatory Visit: Payer: Self-pay | Admitting: Internal Medicine

## 2019-04-20 DIAGNOSIS — E118 Type 2 diabetes mellitus with unspecified complications: Secondary | ICD-10-CM

## 2019-04-20 MED ORDER — METFORMIN HCL ER 500 MG PO TB24: 500.0000 mg | ORAL_TABLET | Freq: Every day | ORAL | 1 refills | Status: DC

## 2019-08-24 ENCOUNTER — Other Ambulatory Visit (HOSPITAL_COMMUNITY)
Admission: RE | Admit: 2019-08-24 | Discharge: 2019-08-24 | Disposition: A | Discharge status: Home / Self Care | Payer: Managed Care, Other (non HMO) | Source: Ambulatory Visit | Attending: Internal Medicine | Admitting: Internal Medicine

## 2019-08-24 ENCOUNTER — Encounter: Payer: Self-pay | Admitting: Internal Medicine

## 2019-08-24 ENCOUNTER — Other Ambulatory Visit: Payer: Self-pay

## 2019-08-24 ENCOUNTER — Ambulatory Visit: Payer: Managed Care, Other (non HMO) | Admitting: Internal Medicine

## 2019-08-24 VITALS — BP 128/80 | HR 91 | Temp 97.0°F | Ht 65.0 in | Wt 222.0 lb

## 2019-08-24 DIAGNOSIS — E118 Type 2 diabetes mellitus with unspecified complications: Secondary | ICD-10-CM

## 2019-08-24 DIAGNOSIS — I10 Essential (primary) hypertension: Secondary | ICD-10-CM

## 2019-08-24 DIAGNOSIS — Z113 Encounter for screening for infections with a predominantly sexual mode of transmission: Secondary | ICD-10-CM | POA: Diagnosis present

## 2019-08-24 NOTE — Progress Notes (Signed)
Date:  08/24/2019   Name:  Molly Kelly   DOB:  Sep 17, 1955   MRN:  [id number]   Chief Complaint: Diabetes (4 month follow up. Foot exam and A1C. ), Hypertension, and std screening (Wants Gono/chlam. testing. )  Diabetes She presents for her follow-up diabetic visit. She has type 2 diabetes mellitus. Her disease course has been stable. Pertinent negatives for hypoglycemia include no headaches or tremors. Pertinent negatives for diabetes include no chest pain, no fatigue, no polydipsia and no polyuria. Current diabetic treatment includes oral agent (monotherapy). She is compliant with treatment all of the time. Her weight is decreasing steadily. An ACE inhibitor/angiotensin II receptor blocker is being taken. Eye exam is not current.  Hypertension This is a chronic problem. The problem is controlled. Pertinent negatives include no chest pain, headaches, palpitations or shortness of breath. Past treatments include ACE inhibitors and diuretics. The current treatment provides significant improvement.  STI testing - she had one sexual encounter several months ago.  Used a condom but it came off.  She has had minor discomfort off and on with urination and would like testing for GC and chlamydia.  She declines HIV testing.  Lab Results  Component Value Date   CREATININE 0.74 04/15/2019   BUN 12 04/15/2019   NA 138 04/15/2019   K 4.7 04/15/2019   CL 103 04/15/2019   CO2 22 04/15/2019   Lab Results  Component Value Date   CHOL 179 04/15/2019   HDL 39 (L) 04/15/2019   LDLCALC 127 (H) 04/15/2019   TRIG 67 04/15/2019   CHOLHDL 4.6 (H) 04/15/2019   Lab Results  Component Value Date   TSH 1.340 04/15/2019   Lab Results  Component Value Date   HGBA1C 6.7 (H) 04/15/2019     Review of Systems  Constitutional: Negative for appetite change, fatigue, fever and unexpected weight change.  HENT: Negative for tinnitus and trouble swallowing.   Eyes: Negative for visual disturbance.   Respiratory: Negative for cough, chest tightness and shortness of breath.   Cardiovascular: Negative for chest pain, palpitations and leg swelling.  Gastrointestinal: Negative for abdominal pain.  Endocrine: Negative for polydipsia and polyuria.  Genitourinary: Negative for dysuria and hematuria.  Musculoskeletal: Negative for arthralgias.  Allergic/Immunologic: Negative for environmental allergies.  Neurological: Negative for tremors, numbness and headaches.  Psychiatric/Behavioral: Negative for dysphoric mood.    Patient Active Problem List   Diagnosis Date Noted  . Type II diabetes mellitus with complication ([place]) [id number]  . Mixed hyperlipidemia 08/11/2018  . Special screening for malignant neoplasms, colon   . Benign neoplasm of transverse colon   . Tubular adenoma of colon   . Coronary artery disease involving native heart 05/02/2015  . Gastroesophageal reflux disease 05/02/2015  . Benign hypertension 05/02/2015    No Known Allergies  Past Surgical History:  Procedure Laterality Date  . CARPAL TUNNEL RELEASE Bilateral    right - 14 yrs ago.  left - 06/2017  . COLONOSCOPY WITH PROPOFOL N/A 04/29/2018   Procedure: COLONOSCOPY WITH BIOPSY;  Surgeon: [name], MD;  Location: [place];  Service: Endoscopy;  Laterality: N/A;  . POLYPECTOMY N/A 04/29/2018   Procedure: POLYPECTOMY;  Surgeon: [name], MD;  Location: [place];  Service: Endoscopy;  Laterality: N/A;    Social History   Tobacco Use  . Smoking status: Never Smoker  . Smokeless tobacco: Never Used  Substance Use Topics  . Alcohol use: Yes    Comment: socially  .  Drug use: No     Medication list has been reviewed and updated.  Current Meds  Medication Sig  . aspirin 81 MG tablet Take 1 tablet by mouth daily.  . enalapril (VASOTEC) 10 MG tablet Take 1 tablet (10 mg total) by mouth daily.  . metFORMIN (GLUCOPHAGE-XR) 500 MG 24 hr tablet Take 1 tablet (500 mg total) by mouth  daily with breakfast.  . Multiple Vitamins-Minerals (MULTIVITAMIN WITH MINERALS) tablet Take 1 tablet by mouth daily.  . rosuvastatin (CRESTOR) 10 MG tablet Take 1 tablet (10 mg total) by mouth daily.  Marland Kitchen spironolactone (ALDACTONE) 25 MG tablet Take 1 tablet (25 mg total) by mouth daily.    PHQ 2/9 Scores 08/24/2019 04/15/2019 12/10/2018 08/11/2018  PHQ - 2 Score 0 0 0 0  PHQ- 9 Score 6 - 0 -    BP Readings from Last 3 Encounters:  08/24/19 128/80  04/15/19 138/70  12/10/18 121/82    Physical Exam Vitals and nursing note reviewed.  Constitutional:      General: She is not in acute distress.    Appearance: Normal appearance. She is well-developed.  HENT:     Head: Normocephalic and atraumatic.  Cardiovascular:     Rate and Rhythm: Normal rate and regular rhythm.     Pulses: Normal pulses.     Heart sounds: No murmur.  Pulmonary:     Effort: Pulmonary effort is normal. No respiratory distress.  Musculoskeletal:     Cervical back: Normal range of motion.     Right lower leg: No edema.     Left lower leg: No edema.  Skin:    General: Skin is warm and dry.     Findings: No rash.  Neurological:     Mental Status: She is alert and oriented to person, place, and time.  Psychiatric:        Behavior: Behavior normal.        Thought Content: Thought content normal.     Wt Readings from Last 3 Encounters:  08/24/19 222 lb (100.7 kg)  04/15/19 231 lb (104.8 kg)  12/10/18 234 lb (106.1 kg)    BP 128/80   Pulse 91   Temp (!) 97 F (36.1 C) (Oral)   Ht 5\' 5"  (1.651 m)   Wt 222 lb (100.7 kg)   SpO2 99%   BMI 36.94 kg/m   Assessment and Plan: 1. Type II diabetes mellitus with complication (HCC) Clinically improved by exam and report without s/s of hypoglycemia. DM complicated by HTN. Tolerating medications metformin well without side effects or other concerns. She is doing very well with diet changes and weight loss - may be able to stop the metformin and monitor She will  schedule an Eye exam and have the note sent to me. - Hemoglobin A1c - Comprehensive metabolic panel  2. Benign hypertension Clinically stable exam with well controlled BP on enalapril and aldactone. Tolerating medications without side effects at this time. Pt to continue current regimen and low sodium diet; benefits of regular exercise as able discussed.  3. Serum calcium elevated Will repeat labs today  4. Routine screening for STI (sexually transmitted infection) Pt declines HIV testing but will get urine testing - GC/Chlamydia probe amp ([place])not at [facility]   Partially dictated using [employer]. Any errors are unintentional.  Halina Maidens, MD [facility]  08/24/2019

## 2019-08-25 LAB — COMPREHENSIVE METABOLIC PANEL
ALT: 16 IU/L (ref 0–32)
AST: 15 IU/L (ref 0–40)
Albumin/Globulin Ratio: 1.6 (ref 1.2–2.2)
Albumin: 4.6 g/dL (ref 3.8–4.8)
Alkaline Phosphatase: 66 IU/L (ref 39–117)
BUN/Creatinine Ratio: 15 (ref 12–28)
BUN: 10 mg/dL (ref 8–27)
Bilirubin Total: 0.4 mg/dL (ref 0.0–1.2)
CO2: 21 mmol/L (ref 20–29)
Calcium: 11.2 mg/dL — ABNORMAL HIGH (ref 8.7–10.3)
Chloride: 103 mmol/L (ref 96–106)
Creatinine, Ser: 0.65 mg/dL (ref 0.57–1.00)
GFR calc Af Amer: 111 mL/min/{1.73_m2} (ref >59)
GFR calc non Af Amer: 96 mL/min/{1.73_m2} (ref >59)
Globulin, Total: 2.9 g/dL (ref 1.5–4.5)
Glucose: 96 mg/dL (ref 65–99)
Potassium: 5.1 mmol/L (ref 3.5–5.2)
Sodium: 139 mmol/L (ref 134–144)
Total Protein: 7.5 g/dL (ref 6.0–8.5)

## 2019-08-25 LAB — GC/CHLAMYDIA PROBE AMP (~~LOC~~) NOT AT ARMC
Chlamydia: NEGATIVE
Comment: NEGATIVE
Comment: NORMAL
Neisseria Gonorrhea: NEGATIVE

## 2019-08-25 LAB — HEMOGLOBIN A1C
Est. average glucose Bld gHb Est-mCnc: 128 mg/dL
Hgb A1c MFr Bld: 6.1 % — ABNORMAL HIGH (ref 4.8–5.6)

## 2019-10-02 ENCOUNTER — Other Ambulatory Visit: Payer: Self-pay | Admitting: Internal Medicine

## 2019-10-02 DIAGNOSIS — E118 Type 2 diabetes mellitus with unspecified complications: Secondary | ICD-10-CM

## 2019-12-08 ENCOUNTER — Other Ambulatory Visit: Payer: Self-pay

## 2019-12-08 ENCOUNTER — Encounter: Payer: Self-pay | Admitting: Internal Medicine

## 2019-12-08 ENCOUNTER — Ambulatory Visit
Admission: RE | Admit: 2019-12-08 | Discharge: 2019-12-08 | Disposition: A | Discharge status: Home / Self Care | Payer: Managed Care, Other (non HMO) | Source: Ambulatory Visit | Attending: Internal Medicine | Admitting: Internal Medicine

## 2019-12-08 ENCOUNTER — Ambulatory Visit: Payer: Managed Care, Other (non HMO) | Admitting: Internal Medicine

## 2019-12-08 VITALS — BP 128/88 | HR 86 | Temp 98.2°F | Ht 65.0 in | Wt 219.0 lb

## 2019-12-08 DIAGNOSIS — Z1231 Encounter for screening mammogram for malignant neoplasm of breast: Secondary | ICD-10-CM

## 2019-12-08 DIAGNOSIS — J0110 Acute frontal sinusitis, unspecified: Secondary | ICD-10-CM

## 2019-12-08 DIAGNOSIS — E782 Mixed hyperlipidemia: Secondary | ICD-10-CM | POA: Diagnosis not present

## 2019-12-08 DIAGNOSIS — I10 Essential (primary) hypertension: Secondary | ICD-10-CM | POA: Diagnosis not present

## 2019-12-08 DIAGNOSIS — E118 Type 2 diabetes mellitus with unspecified complications: Secondary | ICD-10-CM | POA: Diagnosis not present

## 2019-12-08 MED ORDER — AMOXICILLIN-POT CLAVULANATE 875-125 MG PO TABS: 1.0000 | ORAL_TABLET | Freq: Two times a day (BID) | ORAL | 0 refills | Status: AC

## 2019-12-08 NOTE — Patient Instructions (Signed)
Delsym cough Syrup

## 2019-12-08 NOTE — Progress Notes (Signed)
Date:  12/08/2019   Name:  Molly Kelly   DOB:  June 18, 1956   MRN:  [id number]   Chief Complaint: Hypertension (follow up ), Diabetes, and Wheezing ([id number] X2 days X5 days congested, yellow mucous,)  Diabetes She presents for her follow-up diabetic visit. She has type 2 diabetes mellitus. The initial diagnosis of diabetes was made 8 months ago. Her disease course has been stable. Pertinent negatives for hypoglycemia include no dizziness or headaches. Pertinent negatives for diabetes include no chest pain. Current diabetic treatment includes oral agent (monotherapy). She is compliant with treatment all of the time. An ACE inhibitor/angiotensin II receptor blocker is being taken. Eye exam is not current.  Hypertension The problem is controlled. Pertinent negatives include no chest pain, headaches, palpitations or shortness of breath. Past treatments include diuretics and ACE inhibitors. The current treatment provides significant improvement. There are no compliance problems.   Hyperlipidemia The problem is controlled. Pertinent negatives include no chest pain or shortness of breath. Current antihyperlipidemic treatment includes statins. The current treatment provides significant improvement of lipids.  Sinus Problem This is a new problem. The current episode started in the past 7 days. The problem is unchanged. There has been no fever. Associated symptoms include congestion, coughing and sinus pressure. Pertinent negatives include no chills, diaphoresis, headaches, hoarse voice, shortness of breath or swollen glands. Treatments tried: alzaseltzer plus cold.   Immunization History  Administered Date(s) Administered  . Influenza Inj Mdck Quad Pf 04/03/2017  . Influenza,inj,Quad PF,6+ Mos 04/13/2018  . Influenza-Unspecified 06/29/2016, 04/28/2019  . PFIZER SARS-COV-2 Vaccination 08/23/2019, 09/13/2019  . Tdap 09/18/2011     Lab Results  Component Value Date   CREATININE 0.65 08/24/2019     BUN 10 08/24/2019   NA 139 08/24/2019   K 5.1 08/24/2019   CL 103 08/24/2019   CO2 21 08/24/2019   Lab Results  Component Value Date   CHOL 179 04/15/2019   HDL 39 (L) 04/15/2019   LDLCALC 127 (H) 04/15/2019   TRIG 67 04/15/2019   CHOLHDL 4.6 (H) 04/15/2019   Lab Results  Component Value Date   TSH 1.340 04/15/2019   Lab Results  Component Value Date   HGBA1C 6.1 (H) 08/24/2019   Lab Results  Component Value Date   WBC 4.7 04/15/2019   HGB 13.6 04/15/2019   HCT 39.1 04/15/2019   MCV 93 04/15/2019   PLT 279 04/15/2019   Lab Results  Component Value Date   ALT 16 08/24/2019   AST 15 08/24/2019   ALKPHOS 66 08/24/2019   BILITOT 0.4 08/24/2019     Review of Systems  Constitutional: Negative for chills, diaphoresis and fever.  HENT: Positive for congestion and sinus pressure. Negative for hoarse voice.   Respiratory: Positive for cough and wheezing. Negative for shortness of breath.   Cardiovascular: Negative for chest pain, palpitations and leg swelling.  Gastrointestinal: Negative for abdominal pain.  Neurological: Negative for dizziness, light-headedness and headaches.  Psychiatric/Behavioral: Negative for sleep disturbance.    Patient Active Problem List   Diagnosis Date Noted  . Type II diabetes mellitus with complication ([place]) 04/26/2535  . Mixed hyperlipidemia 08/11/2018  . Special screening for malignant neoplasms, colon   . Benign neoplasm of transverse colon   . Tubular adenoma of colon   . Coronary artery disease involving native heart 05/02/2015  . Gastroesophageal reflux disease 05/02/2015  . Benign hypertension 05/02/2015    No Known Allergies  Past Surgical History:  Procedure Laterality Date  .  CARPAL TUNNEL RELEASE Bilateral    right - 14 yrs ago.  left - 06/2017  . COLONOSCOPY WITH PROPOFOL N/A 04/29/2018   Procedure: COLONOSCOPY WITH BIOPSY;  Surgeon: [name], MD;  Location: [place];  Service: Endoscopy;   Laterality: N/A;  . POLYPECTOMY N/A 04/29/2018   Procedure: POLYPECTOMY;  Surgeon: [name], MD;  Location: [place];  Service: Endoscopy;  Laterality: N/A;    Social History   Tobacco Use  . Smoking status: Never Smoker  . Smokeless tobacco: Never Used  Vaping Use  . Vaping Use: Never used  Substance Use Topics  . Alcohol use: Yes    Comment: socially  . Drug use: No     Medication list has been reviewed and updated.  Current Meds  Medication Sig  . Ascorbic Acid (VITA-C PO) Take 1 tablet by mouth daily.  Marland Kitchen aspirin 81 MG tablet Take 1 tablet by mouth daily.  . enalapril (VASOTEC) 10 MG tablet Take 1 tablet (10 mg total) by mouth daily.  . metFORMIN (GLUCOPHAGE-XR) 500 MG 24 hr tablet TAKE 1 TABLET BY MOUTH EVERY DAY WITH BREAKFAST  . Multiple Vitamins-Minerals (ZINC PO) Take 1 tablet by mouth daily.  . rosuvastatin (CRESTOR) 10 MG tablet Take 1 tablet (10 mg total) by mouth daily.  Marland Kitchen spironolactone (ALDACTONE) 25 MG tablet Take 1 tablet (25 mg total) by mouth daily.  Marland Kitchen VITAMIN D PO Take 1 tablet by mouth daily.    PHQ 2/9 Scores 12/08/2019 08/24/2019 04/15/2019 12/10/2018  PHQ - 2 Score 0 0 0 0  PHQ- 9 Score 0 6 - 0    BP Readings from Last 3 Encounters:  12/08/19 128/88  08/24/19 128/80  04/15/19 138/70    Physical Exam Vitals and nursing note reviewed.  Constitutional:      General: She is not in acute distress.    Appearance: Normal appearance. She is well-developed.  HENT:     Head: Normocephalic and atraumatic.     Right Ear: Tympanic membrane and ear canal normal.     Left Ear: Tympanic membrane and ear canal normal.     Nose:     Right Sinus: Maxillary sinus tenderness present.     Left Sinus: Maxillary sinus tenderness present.  Cardiovascular:     Rate and Rhythm: Normal rate and regular rhythm.     Heart sounds: No murmur heard.   Pulmonary:     Effort: Pulmonary effort is normal. No respiratory distress.     Breath sounds:  Transmitted upper airway sounds present. No wheezing or rhonchi.  Musculoskeletal:        General: Normal range of motion.     Cervical back: Normal range of motion.  Lymphadenopathy:     Cervical: No cervical adenopathy.  Skin:    General: Skin is warm and dry.     Findings: No rash.  Neurological:     Mental Status: She is alert and oriented to person, place, and time.  Psychiatric:        Attention and Perception: Attention normal.        Behavior: Behavior normal.        Thought Content: Thought content normal.     Wt Readings from Last 3 Encounters:  12/08/19 219 lb (99.3 kg)  08/24/19 222 lb (100.7 kg)  04/15/19 231 lb (104.8 kg)    BP 128/88   Pulse 86   Temp 98.2 F (36.8 C) (Oral)   Ht 5\' 5"  (1.651 m)  Wt 219 lb (99.3 kg)   SpO2 97%   BMI 36.44 kg/m   Assessment and Plan: 1. Type II diabetes mellitus with complication (HCC) Clinically stable by exam and report without s/s of hypoglycemia. DM complicated by htn. Tolerating medications metformin well without side effects or other concerns. - Hemoglobin A1c  2. Benign hypertension Clinically stable exam with fairly well controlled BP on three agents. Tolerating medications without side effects at this time. Pt to continue current regimen and low sodium diet; benefits of regular exercise as able discussed.  3. Mixed hyperlipidemia On high dose statin  4. Acute non-recurrent frontal sinusitis Continue otc cough and cold medication - amoxicillin-clavulanate (AUGMENTIN) 875-125 MG tablet; Take 1 tablet by mouth 2 (two) times daily for 10 days.  Dispense: 20 tablet; Refill: 0   Partially dictated using [employer]. Any errors are unintentional.  Halina Maidens, MD [facility]  12/08/2019

## 2019-12-09 LAB — HEMOGLOBIN A1C
Est. average glucose Bld gHb Est-mCnc: 131 mg/dL
Hgb A1c MFr Bld: 6.2 % — ABNORMAL HIGH (ref 4.8–5.6)

## 2019-12-09 NOTE — Progress Notes (Signed)
Called pt witn labs left VM to call back.  Will route result note to [facility] Nurse Triage for follow up when patient returns call to clinic.    KP

## 2020-03-29 ENCOUNTER — Other Ambulatory Visit: Payer: Self-pay | Admitting: Internal Medicine

## 2020-03-29 DIAGNOSIS — E118 Type 2 diabetes mellitus with unspecified complications: Secondary | ICD-10-CM

## 2020-03-29 NOTE — Telephone Encounter (Signed)
Requested Prescriptions  Pending Prescriptions Disp Refills  . metFORMIN (GLUCOPHAGE-XR) 500 MG 24 hr tablet [Pharmacy Med Name: METFORMIN HCL ER 500 MG TABLET] 90 tablet 1    Sig: TAKE 1 TABLET BY MOUTH EVERY DAY WITH BREAKFAST     Endocrinology:  Diabetes - Biguanides Passed - 03/29/2020  1:32 AM      Passed - Cr in normal range and within 360 days    Creatinine, Ser  Date Value Ref Range Status  08/24/2019 0.65 0.57 - 1.00 mg/dL Final         Passed - HBA1C is between 0 and 7.9 and within 180 days    Hgb A1c MFr Bld  Date Value Ref Range Status  12/08/2019 6.2 (H) 4.8 - 5.6 % Final    Comment:             Prediabetes: 5.7 - 6.4          Diabetes: >6.4          Glycemic control for adults with diabetes: <7.0          Passed - eGFR in normal range and within 360 days    GFR calc Af Amer  Date Value Ref Range Status  08/24/2019 111 >59 mL/min/1.73 Final   GFR calc non Af Amer  Date Value Ref Range Status  08/24/2019 96 >59 mL/min/1.73 Final         Passed - Valid encounter within last 6 months    Recent Outpatient Visits          3 months ago Type II diabetes mellitus with complication [facility])   [facility] Glean Hess, MD   7 months ago Type II diabetes mellitus with complication [facility])   [facility] Glean Hess, MD   11 months ago Annual physical exam   [facility] Glean Hess, MD   1 year ago Benign hypertension   [facility] Glean Hess, MD   1 year ago Benign hypertension   [facility] Glean Hess, MD      Future Appointments            In 2 weeks Army Melia Jesse Sans, MD [facility], [facility]

## 2020-04-08 ENCOUNTER — Other Ambulatory Visit: Payer: Self-pay | Admitting: Internal Medicine

## 2020-04-08 DIAGNOSIS — E782 Mixed hyperlipidemia: Secondary | ICD-10-CM

## 2020-04-08 DIAGNOSIS — I10 Essential (primary) hypertension: Secondary | ICD-10-CM

## 2020-04-08 NOTE — Telephone Encounter (Signed)
Requested Prescriptions  Pending Prescriptions Disp Refills   spironolactone (ALDACTONE) 25 MG tablet [Pharmacy Med Name: SPIRONOLACTONE 25 MG TABLET] 90 tablet 3    Sig: TAKE 1 TABLET BY MOUTH EVERY DAY     Cardiovascular: Diuretics - Aldosterone Antagonist Passed - 04/08/2020  9:19 AM      Passed - Cr in normal range and within 360 days    Creatinine, Ser  Date Value Ref Range Status  08/24/2019 0.65 0.57 - 1.00 mg/dL Final         Passed - K in normal range and within 360 days    Potassium  Date Value Ref Range Status  08/24/2019 5.1 3.5 - 5.2 mmol/L Final         Passed - Na in normal range and within 360 days    Sodium  Date Value Ref Range Status  08/24/2019 139 134 - 144 mmol/L Final         Passed - Last BP in normal range    BP Readings from Last 1 Encounters:  12/08/19 128/88         Passed - Valid encounter within last 6 months    Recent Outpatient Visits          4 months ago Type II diabetes mellitus with complication [facility])   [facility] Glean Hess, MD   7 months ago Type II diabetes mellitus with complication [facility])   [facility] Glean Hess, MD   11 months ago Annual physical exam   [facility] Glean Hess, MD   1 year ago Benign hypertension   [facility] Glean Hess, MD   1 year ago Benign hypertension   [place], Laura H, MD      Future Appointments            In 1 week Glean Hess, MD [facility]            rosuvastatin (CRESTOR) 10 MG tablet [Pharmacy Med Name: ROSUVASTATIN CALCIUM 10 MG TAB] 90 tablet 3    Sig: TAKE 1 TABLET BY MOUTH EVERY DAY     Cardiovascular:  Antilipid - Statins Failed - 04/08/2020  9:19 AM      Failed - LDL in normal range and within 360 days    LDL Chol Calc (NIH)  Date Value Ref Range Status  04/15/2019 127 (H) 0 - 99 mg/dL Final         Failed - HDL in normal range and within 360 days    HDL  Date  Value Ref Range Status  04/15/2019 39 (L) >39 mg/dL Final         Passed - Total Cholesterol in normal range and within 360 days    Cholesterol, Total  Date Value Ref Range Status  04/15/2019 179 100 - 199 mg/dL Final         Passed - Triglycerides in normal range and within 360 days    Triglycerides  Date Value Ref Range Status  04/15/2019 67 0 - 149 mg/dL Final         Passed - Patient is not pregnant      Passed - Valid encounter within last 12 months    Recent Outpatient Visits          4 months ago Type II diabetes mellitus with complication [facility])   [facility] Glean Hess, MD   7 months ago Type II  diabetes mellitus with complication [facility])   [facility] Glean Hess, MD   11 months ago Annual physical exam   [facility] Glean Hess, MD   1 year ago Benign hypertension   [facility] Glean Hess, MD   1 year ago Benign hypertension   [facility] Glean Hess, MD      Future Appointments            In 1 week Army Melia Jesse Sans, MD [facility], [facility]

## 2020-04-16 ENCOUNTER — Ambulatory Visit (INDEPENDENT_AMBULATORY_CARE_PROVIDER_SITE_OTHER): Payer: Managed Care, Other (non HMO) | Admitting: Internal Medicine

## 2020-04-16 ENCOUNTER — Other Ambulatory Visit: Payer: Self-pay | Admitting: Internal Medicine

## 2020-04-16 ENCOUNTER — Encounter: Payer: Self-pay | Admitting: Internal Medicine

## 2020-04-16 ENCOUNTER — Other Ambulatory Visit: Payer: Self-pay

## 2020-04-16 VITALS — BP 134/82 | HR 71 | Temp 98.6°F | Ht 65.0 in | Wt 221.0 lb

## 2020-04-16 DIAGNOSIS — E782 Mixed hyperlipidemia: Secondary | ICD-10-CM | POA: Diagnosis not present

## 2020-04-16 DIAGNOSIS — I10 Essential (primary) hypertension: Secondary | ICD-10-CM | POA: Diagnosis not present

## 2020-04-16 DIAGNOSIS — R8271 Bacteriuria: Secondary | ICD-10-CM | POA: Diagnosis not present

## 2020-04-16 DIAGNOSIS — Z0000 Encounter for general adult medical examination without abnormal findings: Secondary | ICD-10-CM | POA: Diagnosis not present

## 2020-04-16 DIAGNOSIS — E118 Type 2 diabetes mellitus with unspecified complications: Secondary | ICD-10-CM | POA: Diagnosis not present

## 2020-04-16 DIAGNOSIS — Z23 Encounter for immunization: Secondary | ICD-10-CM | POA: Diagnosis not present

## 2020-04-16 LAB — POCT URINALYSIS DIPSTICK
Bilirubin, UA: NEGATIVE
Glucose, UA: NEGATIVE
Ketones, UA: NEGATIVE
Nitrite, UA: POSITIVE
Protein, UA: NEGATIVE
Spec Grav, UA: 1.02 (ref 1.010–1.025)
Urobilinogen, UA: 0.2 E.U./dL
pH, UA: 5 (ref 5.0–8.0)

## 2020-04-16 MED ORDER — ENALAPRIL MALEATE 10 MG PO TABS: 10.0000 mg | ORAL_TABLET | Freq: Every day | ORAL | 3 refills | Status: DC

## 2020-04-16 MED ORDER — ROSUVASTATIN CALCIUM 10 MG PO TABS: 10.0000 mg | ORAL_TABLET | Freq: Every day | ORAL | 3 refills | Status: DC

## 2020-04-16 NOTE — Progress Notes (Signed)
Date:  04/16/2020   Name:  Molly Kelly   DOB:  1955/10/28   MRN:  [id number]   Chief Complaint: Annual Exam (Breast Exam, Pap smear due next year.) and Flu Vaccine (Reg dose. )  Molly Kelly is a 64 y.o. female who presents today for her Complete Annual Exam. She feels well. She reports exercising walking 3-4 times per week. She reports she is sleeping poorly. Breast complaints none.  Mammogram: 11/2019 DEXA: none Pap smear: 01/2016 neg with cotesting repeat 2022 Colonoscopy: 03/2018  TA - repeat 2024  Immunization History  Administered Date(s) Administered  . Influenza Inj Mdck Quad Pf 04/03/2017  . Influenza,inj,Quad PF,6+ Mos 04/13/2018, 04/16/2020  . Influenza-Unspecified 06/29/2016, 04/28/2019  . PFIZER SARS-COV-2 Vaccination 08/23/2019, 09/13/2019  . Tdap 09/18/2011    Hypertension This is a chronic problem. The problem is controlled (readings at home normal). Pertinent negatives include no chest pain, headaches, palpitations or shortness of breath. Past treatments include diuretics and ACE inhibitors. The current treatment provides significant improvement. There are no compliance problems.  Hypertensive end-organ damage includes CAD/MI.  Diabetes She presents for her follow-up diabetic visit. She has type 2 diabetes mellitus. Her disease course has been stable. Pertinent negatives for hypoglycemia include no dizziness, headaches, nervousness/anxiousness or tremors. Pertinent negatives for diabetes include no chest pain, no fatigue, no polydipsia and no polyuria. Current diabetic treatment includes oral agent (monotherapy). She is compliant with treatment all of the time. Her weight is stable. There is no compliance with monitoring of blood glucose. An ACE inhibitor/angiotensin II receptor blocker is being taken. Eye exam is not current.  Hyperlipidemia This is a chronic problem. The problem is controlled. Pertinent negatives include no chest pain or shortness of  breath. Current antihyperlipidemic treatment includes statins.  CAD - remote hx of nuclear stress test 2001.  Not being followed by cardiology.  Lab Results  Component Value Date   CREATININE 0.65 08/24/2019   BUN 10 08/24/2019   NA 139 08/24/2019   K 5.1 08/24/2019   CL 103 08/24/2019   CO2 21 08/24/2019   Lab Results  Component Value Date   CHOL 179 04/15/2019   HDL 39 (L) 04/15/2019   LDLCALC 127 (H) 04/15/2019   TRIG 67 04/15/2019   CHOLHDL 4.6 (H) 04/15/2019   Lab Results  Component Value Date   TSH 1.340 04/15/2019   Lab Results  Component Value Date   HGBA1C 6.2 (H) 12/08/2019   Lab Results  Component Value Date   WBC 4.7 04/15/2019   HGB 13.6 04/15/2019   HCT 39.1 04/15/2019   MCV 93 04/15/2019   PLT 279 04/15/2019   Lab Results  Component Value Date   ALT 16 08/24/2019   AST 15 08/24/2019   ALKPHOS 66 08/24/2019   BILITOT 0.4 08/24/2019     Review of Systems  Constitutional: Negative for chills, diaphoresis, fatigue and fever.  HENT: Negative for congestion, hearing loss, tinnitus, trouble swallowing and voice change.   Eyes: Negative for visual disturbance.  Respiratory: Negative for cough, chest tightness, shortness of breath and wheezing.   Cardiovascular: Negative for chest pain, palpitations and leg swelling.  Gastrointestinal: Negative for abdominal pain, constipation, diarrhea and vomiting.  Endocrine: Negative for polydipsia and polyuria.  Genitourinary: Negative for dysuria, frequency, genital sores, vaginal bleeding and vaginal discharge.  Musculoskeletal: Negative for arthralgias, gait problem and joint swelling.  Skin: Negative for color change and rash.  Neurological: Negative for dizziness, tremors, light-headedness and headaches.  Hematological: Negative for adenopathy. Does not bruise/bleed easily.  Psychiatric/Behavioral: Negative for dysphoric mood and sleep disturbance. The patient is not nervous/anxious.     Patient Active  Problem List   Diagnosis Date Noted  . Type II diabetes mellitus with complication ([place]) 92/04/9416  . Mixed hyperlipidemia 08/11/2018  . Special screening for malignant neoplasms, colon   . Benign neoplasm of transverse colon   . Tubular adenoma of colon   . Coronary artery disease involving native heart 05/02/2015  . Gastroesophageal reflux disease 05/02/2015  . Benign hypertension 05/02/2015    No Known Allergies  Past Surgical History:  Procedure Laterality Date  . CARPAL TUNNEL RELEASE Bilateral    right - 14 yrs ago.  left - 06/2017  . COLONOSCOPY WITH PROPOFOL N/A 04/29/2018   Procedure: COLONOSCOPY WITH BIOPSY;  Surgeon: [name], MD;  Location: [place];  Service: Endoscopy;  Laterality: N/A;  . POLYPECTOMY N/A 04/29/2018   Procedure: POLYPECTOMY;  Surgeon: [name], MD;  Location: [place];  Service: Endoscopy;  Laterality: N/A;    Social History   Tobacco Use  . Smoking status: Never Smoker  . Smokeless tobacco: Never Used  Vaping Use  . Vaping Use: Never used  Substance Use Topics  . Alcohol use: Yes    Comment: socially  . Drug use: No     Medication list has been reviewed and updated.  Current Meds  Medication Sig  . Ascorbic Acid (VITA-C PO) Take 1 tablet by mouth daily.  Marland Kitchen aspirin 81 MG tablet Take 1 tablet by mouth daily.  . enalapril (VASOTEC) 10 MG tablet Take 1 tablet (10 mg total) by mouth daily.  . metFORMIN (GLUCOPHAGE-XR) 500 MG 24 hr tablet TAKE 1 TABLET BY MOUTH EVERY DAY WITH BREAKFAST  . Multiple Vitamins-Minerals (ZINC PO) Take 1 tablet by mouth daily.  . rosuvastatin (CRESTOR) 10 MG tablet TAKE 1 TABLET BY MOUTH EVERY DAY  . spironolactone (ALDACTONE) 25 MG tablet TAKE 1 TABLET BY MOUTH EVERY DAY  . VITAMIN D PO Take 1 tablet by mouth daily.  Marland Kitchen zinc gluconate 50 MG tablet Take 50 mg by mouth daily.    PHQ 2/9 Scores 04/16/2020 12/08/2019 08/24/2019 04/15/2019  PHQ - 2 Score 0 0 0 0  PHQ- 9 Score 0 0 6 -      GAD 7 : Generalized Anxiety Score 04/16/2020 12/08/2019  Nervous, Anxious, on Edge 0 0  Control/stop worrying 0 0  Worry too much - different things 0 0  Trouble relaxing 0 0  Restless 0 0  Easily annoyed or irritable 0 0  Afraid - awful might happen 0 0  Total GAD 7 Score 0 0  Anxiety Difficulty Not difficult at all Not difficult at all    BP Readings from Last 3 Encounters:  04/16/20 134/82  12/08/19 128/88  08/24/19 128/80    Physical Exam Vitals and nursing note reviewed.  Constitutional:      General: She is not in acute distress.    Appearance: She is well-developed.  HENT:     Head: Normocephalic and atraumatic.     Right Ear: Tympanic membrane and ear canal normal.     Left Ear: Tympanic membrane and ear canal normal.     Nose:     Right Sinus: No maxillary sinus tenderness.     Left Sinus: No maxillary sinus tenderness.  Eyes:     General: No scleral icterus.       Right eye: No discharge.  Left eye: No discharge.     Conjunctiva/sclera: Conjunctivae normal.  Neck:     Thyroid: No thyromegaly.     Vascular: No carotid bruit.  Cardiovascular:     Rate and Rhythm: Normal rate and regular rhythm.     Pulses: Normal pulses.     Heart sounds: Normal heart sounds.  Pulmonary:     Effort: Pulmonary effort is normal. No respiratory distress.     Breath sounds: No wheezing.  Chest:     Breasts:        Right: No mass, nipple discharge, skin change or tenderness.        Left: No mass, nipple discharge, skin change or tenderness.  Abdominal:     General: Bowel sounds are normal.     Palpations: Abdomen is soft.     Tenderness: There is no abdominal tenderness.  Musculoskeletal:     Cervical back: Normal range of motion. No erythema.     Right lower leg: No edema.     Left lower leg: No edema.  Lymphadenopathy:     Cervical: No cervical adenopathy.  Skin:    General: Skin is warm and dry.     Capillary Refill: Capillary refill takes less than 2  seconds.     Findings: No rash.  Neurological:     General: No focal deficit present.     Mental Status: She is alert and oriented to person, place, and time.     Cranial Nerves: No cranial nerve deficit.     Sensory: No sensory deficit.     Deep Tendon Reflexes: Reflexes are normal and symmetric.  Psychiatric:        Attention and Perception: Attention normal.        Mood and Affect: Mood normal.     Wt Readings from Last 3 Encounters:  04/16/20 221 lb (100.2 kg)  12/08/19 219 lb (99.3 kg)  08/24/19 222 lb (100.7 kg)    BP 134/82   Pulse 71   Temp 98.6 F (37 C) (Oral)   Ht 5\' 5"  (1.651 m)   Wt 221 lb (100.2 kg)   SpO2 100%   BMI 36.78 kg/m   Assessment and Plan: 1. Annual physical exam Exam is normal except for weight. Encourage regular exercise and appropriate dietary changes. UA positive but asx - will send for culture - POCT urinalysis dipstick  2. Benign hypertension Clinically stable exam with well controlled BP on enalapril. Tolerating medications without side effects at this time. Pt to continue current regimen and low sodium diet; benefits of regular exercise as able discussed. - CBC with Differential/Platelet - TSH - enalapril (VASOTEC) 10 MG tablet; Take 1 tablet (10 mg total) by mouth daily.  Dispense: 90 tablet; Refill: 3  3. Type II diabetes mellitus with complication (HCC) Clinically stable by exam and report without s/s of hypoglycemia. DM complicated by htn. Tolerating medications (metformin) well without side effects or other concerns. - Comprehensive metabolic panel - Hemoglobin A1c  4. Mixed hyperlipidemia Tolerating statin medication without side effects at this time LDL is at goal of < 70 on current dose Continue same therapy without change at this time. - Lipid panel - rosuvastatin (CRESTOR) 10 MG tablet; Take 1 tablet (10 mg total) by mouth daily.  Dispense: 90 tablet; Refill: 3  5. Need for vaccination for pneumococcus Pt declines  today  6. Need for immunization against influenza - Flu Vaccine QUAD 36+ mos IM   Partially dictated using Editor, commissioning. Any  errors are unintentional.  Halina Maidens, MD [facility]  04/16/2020

## 2020-04-17 LAB — COMPREHENSIVE METABOLIC PANEL
ALT: 15 IU/L (ref 0–32)
AST: 13 IU/L (ref 0–40)
Albumin/Globulin Ratio: 1.6 (ref 1.2–2.2)
Albumin: 4.6 g/dL (ref 3.8–4.8)
Alkaline Phosphatase: 63 IU/L (ref 44–121)
BUN/Creatinine Ratio: 12 (ref 12–28)
BUN: 8 mg/dL (ref 8–27)
Bilirubin Total: 0.3 mg/dL (ref 0.0–1.2)
CO2: 22 mmol/L (ref 20–29)
Calcium: 10.5 mg/dL — ABNORMAL HIGH (ref 8.7–10.3)
Chloride: 102 mmol/L (ref 96–106)
Creatinine, Ser: 0.68 mg/dL (ref 0.57–1.00)
GFR calc Af Amer: 108 mL/min/{1.73_m2} (ref >59)
GFR calc non Af Amer: 94 mL/min/{1.73_m2} (ref >59)
Globulin, Total: 2.8 g/dL (ref 1.5–4.5)
Glucose: 105 mg/dL — ABNORMAL HIGH (ref 65–99)
Potassium: 4.6 mmol/L (ref 3.5–5.2)
Sodium: 137 mmol/L (ref 134–144)
Total Protein: 7.4 g/dL (ref 6.0–8.5)

## 2020-04-17 LAB — CBC WITH DIFFERENTIAL/PLATELET
Basophils Absolute: 0 10*3/uL (ref 0.0–0.2)
Basos: 1 %
EOS (ABSOLUTE): 0.1 10*3/uL (ref 0.0–0.4)
Eos: 2 %
Hematocrit: 39.2 % (ref 34.0–46.6)
Hemoglobin: 13.1 g/dL (ref 11.1–15.9)
Immature Grans (Abs): 0 10*3/uL (ref 0.0–0.1)
Immature Granulocytes: 0 %
Lymphocytes Absolute: 1.7 10*3/uL (ref 0.7–3.1)
Lymphs: 42 %
MCH: 31.5 pg (ref 26.6–33.0)
MCHC: 33.4 g/dL (ref 31.5–35.7)
MCV: 94 fL (ref 79–97)
Monocytes Absolute: 0.4 10*3/uL (ref 0.1–0.9)
Monocytes: 9 %
Neutrophils Absolute: 1.8 10*3/uL (ref 1.4–7.0)
Neutrophils: 46 %
Platelets: 281 10*3/uL (ref 150–450)
RBC: 4.16 x10E6/uL (ref 3.77–5.28)
RDW: 12.1 % (ref 11.7–15.4)
WBC: 3.9 10*3/uL (ref 3.4–10.8)

## 2020-04-17 LAB — LIPID PANEL
Chol/HDL Ratio: 4.2 ratio (ref 0.0–4.4)
Cholesterol, Total: 174 mg/dL (ref 100–199)
HDL: 41 mg/dL (ref >39)
LDL Chol Calc (NIH): 119 mg/dL — ABNORMAL HIGH (ref 0–99)
Triglycerides: 71 mg/dL (ref 0–149)
VLDL Cholesterol Cal: 14 mg/dL (ref 5–40)

## 2020-04-17 LAB — HEMOGLOBIN A1C
Est. average glucose Bld gHb Est-mCnc: 134 mg/dL
Hgb A1c MFr Bld: 6.3 % — ABNORMAL HIGH (ref 4.8–5.6)

## 2020-04-17 LAB — TSH: TSH: 1.19 u[IU]/mL (ref 0.450–4.500)

## 2020-04-19 ENCOUNTER — Telehealth: Payer: Self-pay | Admitting: *Deleted

## 2020-04-19 LAB — URINE CULTURE

## 2020-04-19 NOTE — Telephone Encounter (Signed)
Reviewed lab results and physician's note with the patient.  Final urine culture is available in Epic but not reviewed-shows Escherichia coli. Patient denies any urinary symptoms only occasional cramping just above umbilicus which she describes as heartburn and is not associated with her voiding.   [facility] on file.  Routing to clinic for physician to review.

## 2020-04-20 ENCOUNTER — Other Ambulatory Visit: Payer: Self-pay

## 2020-04-20 DIAGNOSIS — R8279 Other abnormal findings on microbiological examination of urine: Secondary | ICD-10-CM

## 2020-04-20 MED ORDER — NITROFURANTOIN MONOHYD MACRO 100 MG PO CAPS: 100.0000 mg | ORAL_CAPSULE | Freq: Two times a day (BID) | ORAL | 0 refills | Status: AC

## 2020-04-20 NOTE — Telephone Encounter (Signed)
Noted  KP 

## 2020-04-20 NOTE — Progress Notes (Signed)
See note from 04/19/2020.  KP

## 2020-04-20 NOTE — Telephone Encounter (Signed)
Called pt told her that her urine culture was positive. Sent in macrobid 100 mg. Take one pill X2 a day for 7 days. Pt verbalized understanding.  KP

## 2020-07-09 ENCOUNTER — Ambulatory Visit (INDEPENDENT_AMBULATORY_CARE_PROVIDER_SITE_OTHER): Payer: Managed Care, Other (non HMO) | Admitting: Internal Medicine

## 2020-07-09 ENCOUNTER — Other Ambulatory Visit: Payer: Self-pay

## 2020-07-09 ENCOUNTER — Encounter: Payer: Self-pay | Admitting: Internal Medicine

## 2020-07-09 VITALS — BP 124/70 | HR 89 | Temp 98.3°F | Ht 65.0 in | Wt 222.0 lb

## 2020-07-09 DIAGNOSIS — J40 Bronchitis, not specified as acute or chronic: Secondary | ICD-10-CM | POA: Diagnosis not present

## 2020-07-09 MED ORDER — ALBUTEROL SULFATE HFA 108 (90 BASE) MCG/ACT IN AERS: 2.0000 | INHALATION_SPRAY | Freq: Four times a day (QID) | RESPIRATORY_TRACT | 0 refills | Status: DC | PRN

## 2020-07-09 MED ORDER — AZITHROMYCIN 250 MG PO TABS: ORAL_TABLET | ORAL | 0 refills | Status: AC

## 2020-07-09 NOTE — Progress Notes (Signed)
Date:  07/09/2020   Name:  Molly Kelly   DOB:  11-Sep-1955   MRN:  [id number]   Chief Complaint: Cough (Cough with wheezing. Started Friday morning. Started with nose running and cough. No fever. Can hear with wheezing with cough when laying down. No production.)  Immunization History  Administered Date(s) Administered  . Influenza Inj Mdck Quad Pf 04/03/2017  . Influenza,inj,Quad PF,6+ Mos 04/13/2018, 04/16/2020  . Influenza-Unspecified 06/29/2016, 04/28/2019  . PFIZER SARS-COV-2 Vaccination 08/23/2019, 09/13/2019, 05/04/2020  . Tdap 09/18/2011    Cough This is a new problem. The current episode started in the past 7 days. The problem has been unchanged. The problem occurs every few minutes. The cough is non-productive. Associated symptoms include wheezing. Pertinent negatives include no chest pain, chills, fever, rhinorrhea or shortness of breath. The symptoms are aggravated by lying down. She has tried OTC cough suppressant for the symptoms. The treatment provided mild relief.    Lab Results  Component Value Date   CREATININE 0.68 04/16/2020   BUN 8 04/16/2020   NA 137 04/16/2020   K 4.6 04/16/2020   CL 102 04/16/2020   CO2 22 04/16/2020   Lab Results  Component Value Date   CHOL 174 04/16/2020   HDL 41 04/16/2020   LDLCALC 119 (H) 04/16/2020   TRIG 71 04/16/2020   CHOLHDL 4.2 04/16/2020   Lab Results  Component Value Date   TSH 1.190 04/16/2020   Lab Results  Component Value Date   HGBA1C 6.3 (H) 04/16/2020   Lab Results  Component Value Date   WBC 3.9 04/16/2020   HGB 13.1 04/16/2020   HCT 39.2 04/16/2020   MCV 94 04/16/2020   PLT 281 04/16/2020   Lab Results  Component Value Date   ALT 15 04/16/2020   AST 13 04/16/2020   ALKPHOS 63 04/16/2020   BILITOT 0.3 04/16/2020     Review of Systems  Constitutional: Negative for chills and fever.  HENT: Negative for congestion, rhinorrhea, sinus pressure and sinus pain.        No loss of taste or  smell  Eyes: Negative for visual disturbance.  Respiratory: Positive for cough and wheezing. Negative for shortness of breath.   Cardiovascular: Negative for chest pain, palpitations and leg swelling.  Neurological: Negative for tremors, seizures and numbness.    Patient Active Problem List   Diagnosis Date Noted  . Type II diabetes mellitus with complication ([place]) 48/54/6270  . Mixed hyperlipidemia 08/11/2018  . Special screening for malignant neoplasms, colon   . Benign neoplasm of transverse colon   . Tubular adenoma of colon   . Coronary artery disease involving native heart 05/02/2015  . Gastroesophageal reflux disease 05/02/2015  . Benign hypertension 05/02/2015    No Known Allergies  Past Surgical History:  Procedure Laterality Date  . CARPAL TUNNEL RELEASE Bilateral    right - 14 yrs ago.  left - 06/2017  . COLONOSCOPY WITH PROPOFOL N/A 04/29/2018   Procedure: COLONOSCOPY WITH BIOPSY;  Surgeon: [name], MD;  Location: [place];  Service: Endoscopy;  Laterality: N/A;  . POLYPECTOMY N/A 04/29/2018   Procedure: POLYPECTOMY;  Surgeon: [name], MD;  Location: [place];  Service: Endoscopy;  Laterality: N/A;    Social History   Tobacco Use  . Smoking status: Never Smoker  . Smokeless tobacco: Never Used  Vaping Use  . Vaping Use: Never used  Substance Use Topics  . Alcohol use: Yes    Comment: socially  .  Drug use: No     Medication list has been reviewed and updated.  Current Meds  Medication Sig  . Ascorbic Acid (VITA-C PO) Take 1 tablet by mouth daily.  Marland Kitchen aspirin 81 MG tablet Take 1 tablet by mouth daily.  . enalapril (VASOTEC) 10 MG tablet Take 1 tablet (10 mg total) by mouth daily.  . metFORMIN (GLUCOPHAGE-XR) 500 MG 24 hr tablet TAKE 1 TABLET BY MOUTH EVERY DAY WITH BREAKFAST  . Multiple Vitamins-Minerals (ZINC PO) Take 1 tablet by mouth daily.  . rosuvastatin (CRESTOR) 10 MG tablet Take 1 tablet (10 mg total) by mouth  daily.  Marland Kitchen spironolactone (ALDACTONE) 25 MG tablet TAKE 1 TABLET BY MOUTH EVERY DAY  . VITAMIN D PO Take 1 tablet by mouth daily.  Marland Kitchen zinc gluconate 50 MG tablet Take 50 mg by mouth daily.    PHQ 2/9 Scores 07/09/2020 04/16/2020 12/08/2019 08/24/2019  PHQ - 2 Score 0 0 0 0  PHQ- 9 Score 0 0 0 6    GAD 7 : Generalized Anxiety Score 07/09/2020 04/16/2020 12/08/2019  Nervous, Anxious, on Edge 0 0 0  Control/stop worrying 0 0 0  Worry too much - different things 0 0 0  Trouble relaxing 0 0 0  Restless 0 0 0  Easily annoyed or irritable 0 0 0  Afraid - awful might happen 0 0 0  Total GAD 7 Score 0 0 0  Anxiety Difficulty Not difficult at all Not difficult at all Not difficult at all    BP Readings from Last 3 Encounters:  07/09/20 124/70  04/16/20 134/82  12/08/19 128/88    Physical Exam Constitutional:      Appearance: Normal appearance.  HENT:     Right Ear: Tympanic membrane and ear canal normal.     Left Ear: Tympanic membrane and ear canal normal.     Nose: Nose normal.     Mouth/Throat:     Pharynx: No oropharyngeal exudate or posterior oropharyngeal erythema.  Cardiovascular:     Rate and Rhythm: Normal rate and regular rhythm.     Pulses: Normal pulses.     Heart sounds: No murmur heard.   Pulmonary:     Effort: Pulmonary effort is normal.     Breath sounds: No wheezing or rhonchi.  Musculoskeletal:     Cervical back: Normal range of motion.  Lymphadenopathy:     Cervical: No cervical adenopathy.  Neurological:     Mental Status: She is alert.     Wt Readings from Last 3 Encounters:  07/09/20 222 lb (100.7 kg)  04/16/20 221 lb (100.2 kg)  12/08/19 219 lb (99.3 kg)    BP 124/70   Pulse 89   Temp 98.3 F (36.8 C) (Oral)   Ht 5\' 5"  (1.651 m)   Wt 222 lb (100.7 kg)   SpO2 99%   BMI 36.94 kg/m   Assessment and Plan: 1. Bronchitis  Continue fluids and Robitussion - azithromycin (ZITHROMAX Z-PAK) 250 MG tablet; UAD  Dispense: 6 each; Refill: 0 -  albuterol (VENTOLIN HFA) 108 (90 Base) MCG/ACT inhaler; Inhale 2 puffs into the lungs every 6 (six) hours as needed for wheezing or shortness of breath.  Dispense: 1 each; Refill: 0   Partially dictated using Editor, commissioning. Any errors are unintentional.  Halina Maidens, MD [facility]  07/09/2020

## 2020-07-27 ENCOUNTER — Other Ambulatory Visit: Payer: Self-pay | Admitting: Internal Medicine

## 2020-07-27 DIAGNOSIS — J40 Bronchitis, not specified as acute or chronic: Secondary | ICD-10-CM

## 2020-08-04 IMAGING — MG DIGITAL SCREENING BILAT W/ TOMO W/ CAD
8 series · 8 of 24 positions shown · non-contrast
Comparison: Previous exam(s).

ACR Breast Density Category a: The breast tissue is almost entirely
fatty.

CLINICAL DATA: Screening.

EXAM:
DIGITAL SCREENING BILATERAL MAMMOGRAM WITH TOMO AND CAD

[L MLO synth-2D]
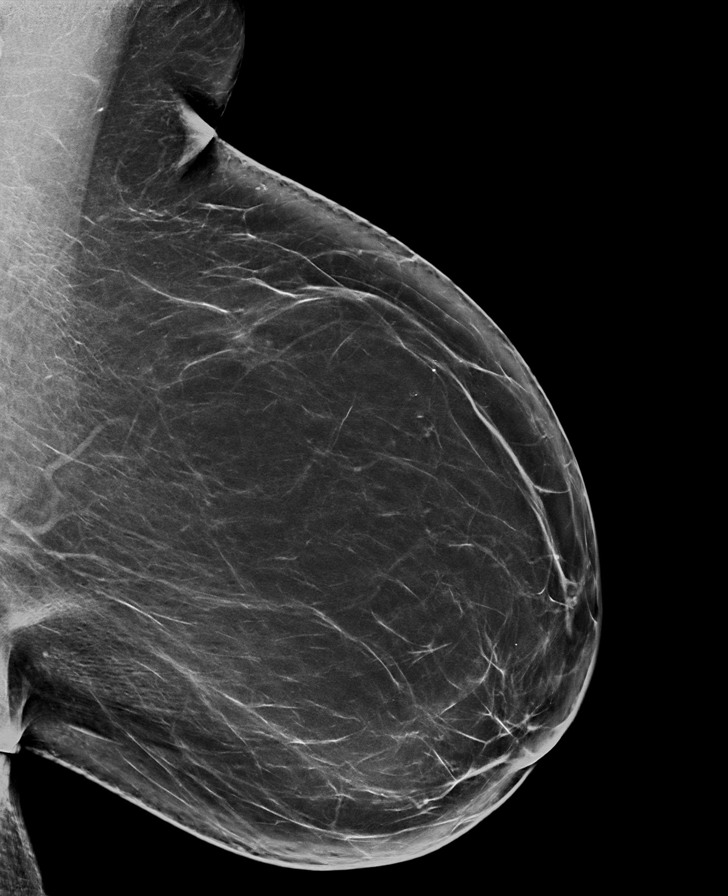

[R CC synth-2D]
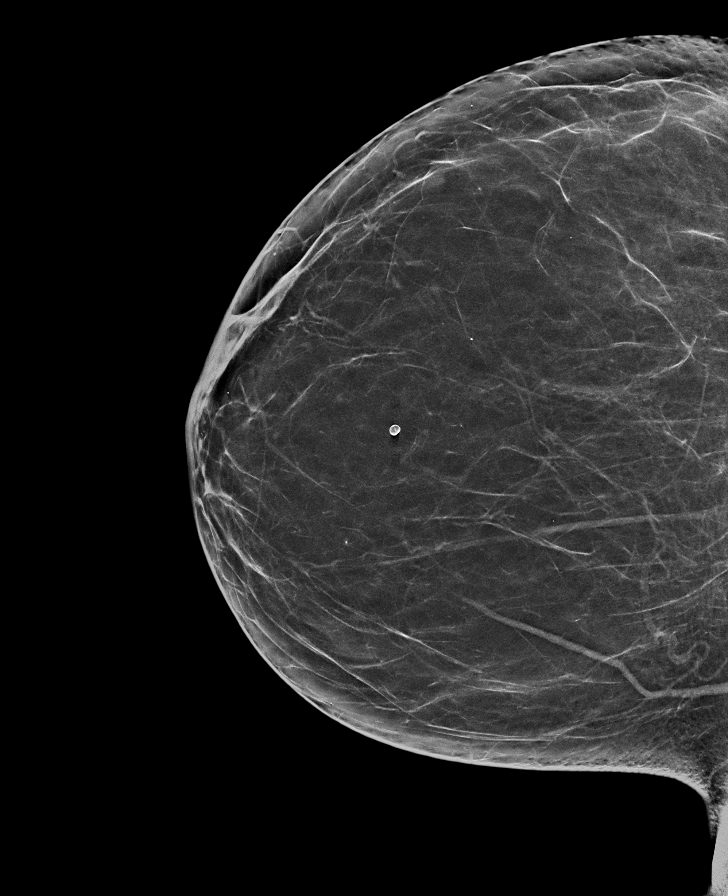

[L CC synth-2D]
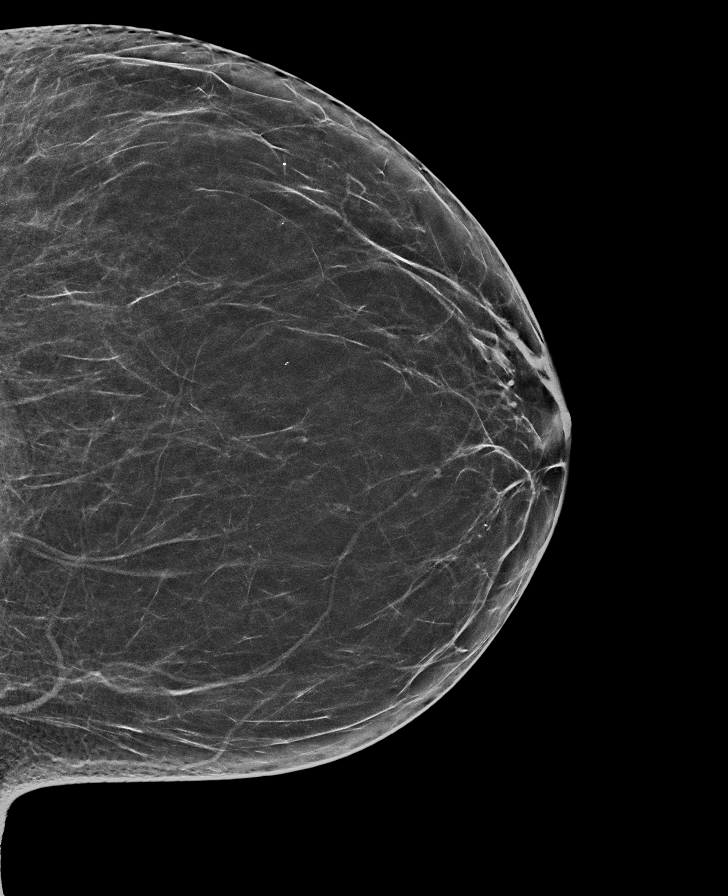

[R MLO synth-2D]
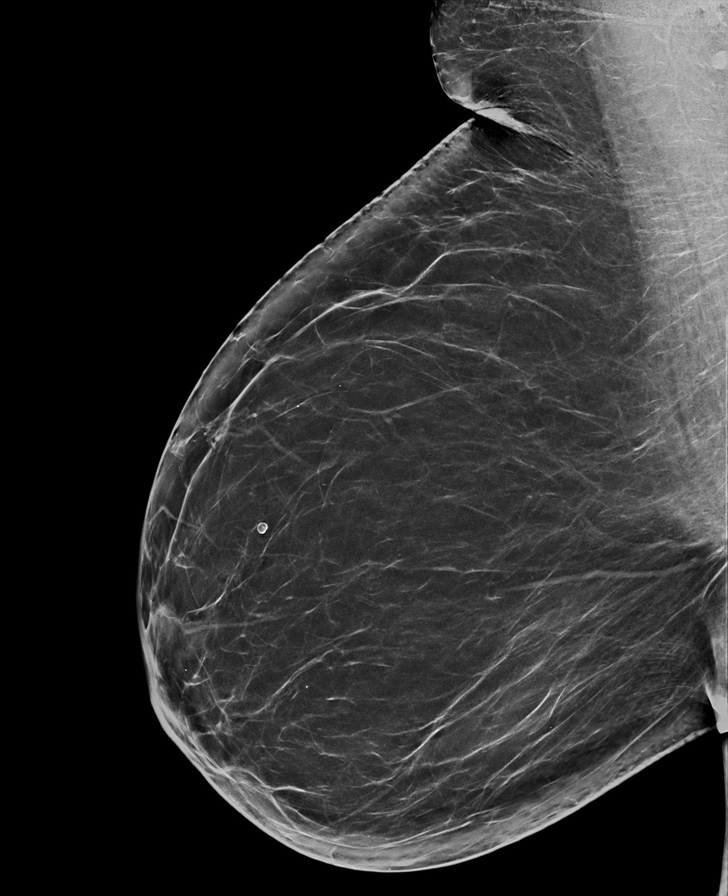

[R CC tomo · tomo slice 35/68.0]
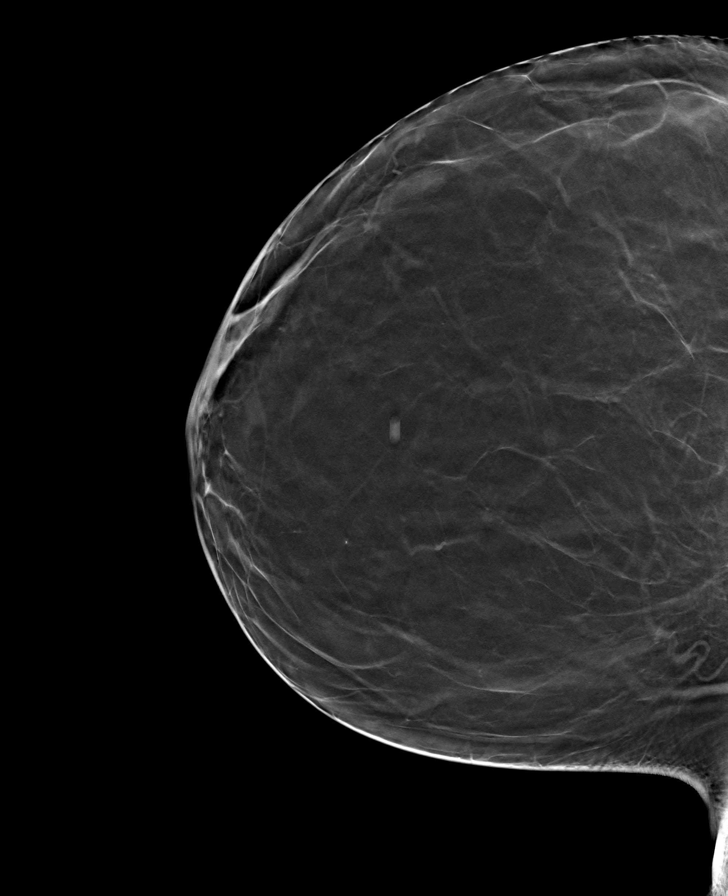

[L CC tomo · tomo slice 34/67.0]
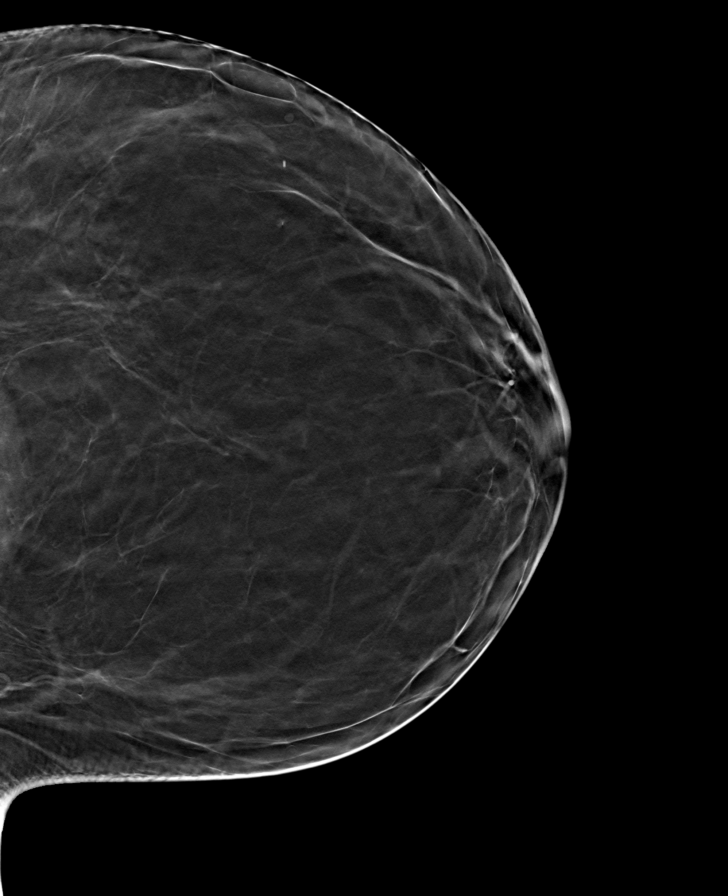

[L MLO tomo · tomo slice 43/85.0]
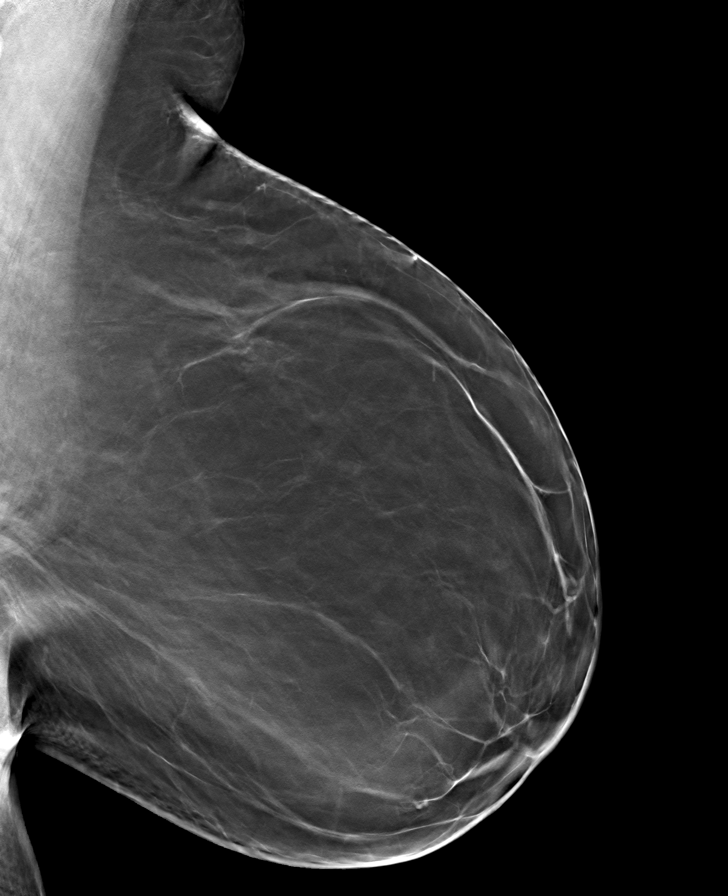

[R MLO tomo · tomo slice 41/82.0]
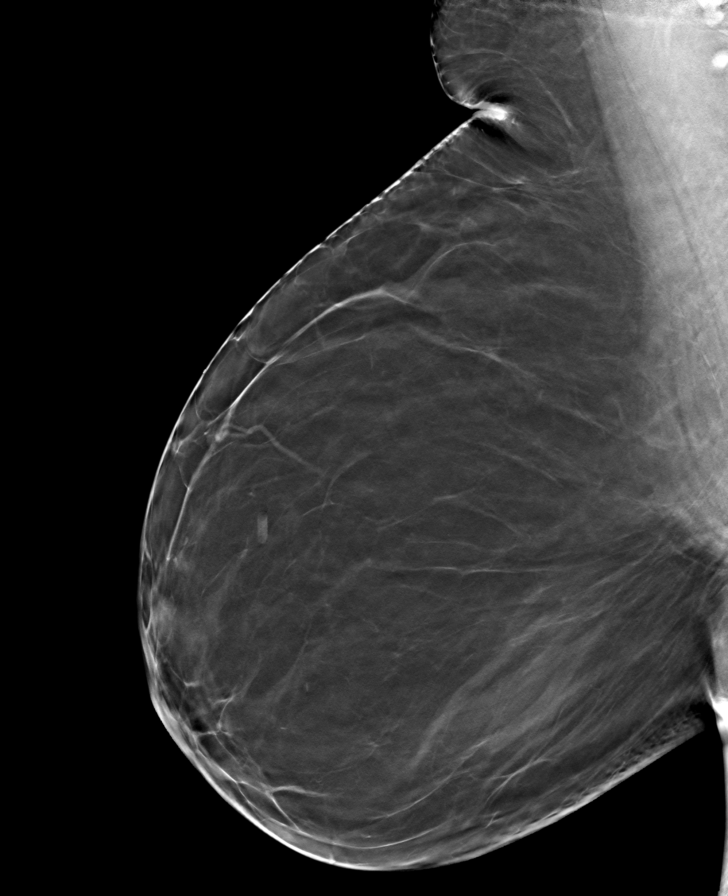

[8 of 24 positions shown; findings below may reference images not displayed]

FINDINGS: There are no findings suspicious for malignancy. Images were
processed with CAD.
IMPRESSION: No mammographic evidence of malignancy. A result letter of this
screening mammogram will be mailed directly to the patient.

RECOMMENDATION:
Screening mammogram in one year. (Code:[id number]-[id number])

BI-RADS CATEGORY  1: Negative.

## 2020-08-20 ENCOUNTER — Ambulatory Visit: Payer: Managed Care, Other (non HMO) | Admitting: Internal Medicine

## 2020-08-20 NOTE — Progress Notes (Deleted)
Date:  08/20/2020   Name:  Molly Kelly   DOB:  05-15-1956   MRN:  [id number]   Chief Complaint: No chief complaint on file.  Diabetes She presents for her follow-up diabetic visit. She has type 2 diabetes mellitus. Her disease course has been stable. Pertinent negatives for hypoglycemia include no headaches or tremors. Pertinent negatives for diabetes include no chest pain, no fatigue, no polydipsia and no polyuria. Current diabetic treatment includes oral agent (monotherapy) (metformin). She is compliant with treatment all of the time. An ACE inhibitor/angiotensin II receptor blocker is being taken.  Hypertension This is a chronic problem. The problem is controlled. Pertinent negatives include no chest pain, headaches, palpitations or shortness of breath. Past treatments include diuretics and ACE inhibitors. The current treatment provides significant improvement.    Lab Results  Component Value Date   CREATININE 0.68 04/16/2020   BUN 8 04/16/2020   NA 137 04/16/2020   K 4.6 04/16/2020   CL 102 04/16/2020   CO2 22 04/16/2020   Lab Results  Component Value Date   CHOL 174 04/16/2020   HDL 41 04/16/2020   LDLCALC 119 (H) 04/16/2020   TRIG 71 04/16/2020   CHOLHDL 4.2 04/16/2020   Lab Results  Component Value Date   TSH 1.190 04/16/2020   Lab Results  Component Value Date   HGBA1C 6.3 (H) 04/16/2020   Lab Results  Component Value Date   WBC 3.9 04/16/2020   HGB 13.1 04/16/2020   HCT 39.2 04/16/2020   MCV 94 04/16/2020   PLT 281 04/16/2020   Lab Results  Component Value Date   ALT 15 04/16/2020   AST 13 04/16/2020   ALKPHOS 63 04/16/2020   BILITOT 0.3 04/16/2020     Review of Systems  Constitutional: Negative for appetite change, fatigue, fever and unexpected weight change.  HENT: Negative for tinnitus and trouble swallowing.   Eyes: Negative for visual disturbance.  Respiratory: Negative for cough, chest tightness and shortness of breath.    Cardiovascular: Negative for chest pain, palpitations and leg swelling.  Gastrointestinal: Negative for abdominal pain.  Endocrine: Negative for polydipsia and polyuria.  Genitourinary: Negative for dysuria and hematuria.  Musculoskeletal: Negative for arthralgias.  Neurological: Negative for tremors, numbness and headaches.  Psychiatric/Behavioral: Negative for dysphoric mood.    Patient Active Problem List   Diagnosis Date Noted  . Type II diabetes mellitus with complication ([place]) 97/67/3419  . Mixed hyperlipidemia 08/11/2018  . Special screening for malignant neoplasms, colon   . Benign neoplasm of transverse colon   . Tubular adenoma of colon   . Coronary artery disease involving native heart 05/02/2015  . Gastroesophageal reflux disease 05/02/2015  . Benign hypertension 05/02/2015    No Known Allergies  Past Surgical History:  Procedure Laterality Date  . CARPAL TUNNEL RELEASE Bilateral    right - 14 yrs ago.  left - 06/2017  . COLONOSCOPY WITH PROPOFOL N/A 04/29/2018   Procedure: COLONOSCOPY WITH BIOPSY;  Surgeon: [name], MD;  Location: [place];  Service: Endoscopy;  Laterality: N/A;  . POLYPECTOMY N/A 04/29/2018   Procedure: POLYPECTOMY;  Surgeon: [name], MD;  Location: [place];  Service: Endoscopy;  Laterality: N/A;    Social History   Tobacco Use  . Smoking status: Never Smoker  . Smokeless tobacco: Never Used  Vaping Use  . Vaping Use: Never used  Substance Use Topics  . Alcohol use: Yes    Comment: socially  . Drug use:  No     Medication list has been reviewed and updated.  No outpatient medications have been marked as taking for the 08/20/20 encounter (Appointment) with Glean Hess, MD.    [facility] 2/9 Scores 07/09/2020 04/16/2020 12/08/2019 08/24/2019  PHQ - 2 Score 0 0 0 0  PHQ- 9 Score 0 0 0 6    GAD 7 : Generalized Anxiety Score 07/09/2020 04/16/2020 12/08/2019  Nervous, Anxious, on Edge 0 0 0  Control/stop  worrying 0 0 0  Worry too much - different things 0 0 0  Trouble relaxing 0 0 0  Restless 0 0 0  Easily annoyed or irritable 0 0 0  Afraid - awful might happen 0 0 0  Total GAD 7 Score 0 0 0  Anxiety Difficulty Not difficult at all Not difficult at all Not difficult at all    BP Readings from Last 3 Encounters:  07/09/20 124/70  04/16/20 134/82  12/08/19 128/88    Physical Exam Vitals and nursing note reviewed.  Constitutional:      General: She is not in acute distress.    Appearance: She is well-developed.  HENT:     Head: Normocephalic and atraumatic.  Pulmonary:     Effort: Pulmonary effort is normal. No respiratory distress.  Skin:    General: Skin is warm and dry.     Findings: No rash.  Neurological:     Mental Status: She is alert and oriented to person, place, and time.  Psychiatric:        Mood and Affect: Mood normal.        Behavior: Behavior normal.     Wt Readings from Last 3 Encounters:  07/09/20 222 lb (100.7 kg)  04/16/20 221 lb (100.2 kg)  12/08/19 219 lb (99.3 kg)    There were no vitals taken for this visit.  Assessment and Plan:

## 2020-09-18 ENCOUNTER — Other Ambulatory Visit: Payer: Self-pay

## 2020-09-18 ENCOUNTER — Ambulatory Visit: Payer: Managed Care, Other (non HMO) | Admitting: Internal Medicine

## 2020-09-18 ENCOUNTER — Encounter: Payer: Self-pay | Admitting: Internal Medicine

## 2020-09-18 VITALS — BP 128/82 | HR 75 | Ht 65.0 in | Wt 223.0 lb

## 2020-09-18 DIAGNOSIS — E118 Type 2 diabetes mellitus with unspecified complications: Secondary | ICD-10-CM | POA: Diagnosis not present

## 2020-09-18 DIAGNOSIS — Z23 Encounter for immunization: Secondary | ICD-10-CM | POA: Diagnosis not present

## 2020-09-18 LAB — POCT GLYCOSYLATED HEMOGLOBIN (HGB A1C): Hemoglobin A1C: 5.9 % — AB (ref 4.0–5.6)

## 2020-09-18 MED ORDER — METFORMIN HCL ER 500 MG PO TB24: ORAL_TABLET | ORAL | 1 refills | Status: DC

## 2020-09-18 NOTE — Progress Notes (Signed)
Date:  09/18/2020   Name:  Molly Kelly   DOB:  1956/03/20   MRN:  [id number]   Chief Complaint: Diabetes (Will get eye exam next month and told pt to send report to our office. )  Diabetes She presents for her follow-up diabetic visit. She has type 2 diabetes mellitus. Her disease course has been stable. Pertinent negatives for hypoglycemia include no headaches or tremors. Pertinent negatives for diabetes include no chest pain, no fatigue, no polydipsia and no polyuria. Current diabetic treatment includes oral agent (monotherapy). She is compliant with treatment all of the time. An ACE inhibitor/angiotensin II receptor blocker is being taken.    Lab Results  Component Value Date   CREATININE 0.68 04/16/2020   BUN 8 04/16/2020   NA 137 04/16/2020   K 4.6 04/16/2020   CL 102 04/16/2020   CO2 22 04/16/2020   Lab Results  Component Value Date   CHOL 174 04/16/2020   HDL 41 04/16/2020   LDLCALC 119 (H) 04/16/2020   TRIG 71 04/16/2020   CHOLHDL 4.2 04/16/2020   Lab Results  Component Value Date   TSH 1.190 04/16/2020   Lab Results  Component Value Date   HGBA1C 5.9 (A) 09/18/2020   Lab Results  Component Value Date   WBC 3.9 04/16/2020   HGB 13.1 04/16/2020   HCT 39.2 04/16/2020   MCV 94 04/16/2020   PLT 281 04/16/2020   Lab Results  Component Value Date   ALT 15 04/16/2020   AST 13 04/16/2020   ALKPHOS 63 04/16/2020   BILITOT 0.3 04/16/2020     Review of Systems  Constitutional: Negative for appetite change, fatigue, fever and unexpected weight change.  HENT: Negative for tinnitus and trouble swallowing.   Eyes: Negative for visual disturbance.  Respiratory: Negative for cough, chest tightness and shortness of breath.   Cardiovascular: Negative for chest pain, palpitations and leg swelling.  Gastrointestinal: Negative for abdominal pain.  Endocrine: Negative for polydipsia and polyuria.  Genitourinary: Negative for dysuria and hematuria.   Musculoskeletal: Negative for arthralgias.  Neurological: Negative for tremors, numbness and headaches.  Psychiatric/Behavioral: Negative for dysphoric mood.    Patient Active Problem List   Diagnosis Date Noted  . Type II diabetes mellitus with complication ([place]) 85/63/1497  . Mixed hyperlipidemia 08/11/2018  . Special screening for malignant neoplasms, colon   . Benign neoplasm of transverse colon   . Tubular adenoma of colon   . Coronary artery disease involving native heart 05/02/2015  . Gastroesophageal reflux disease 05/02/2015  . Benign hypertension 05/02/2015    No Known Allergies  Past Surgical History:  Procedure Laterality Date  . CARPAL TUNNEL RELEASE Bilateral    right - 14 yrs ago.  left - 06/2017  . COLONOSCOPY WITH PROPOFOL N/A 04/29/2018   Procedure: COLONOSCOPY WITH BIOPSY;  Surgeon: [name], MD;  Location: [place];  Service: Endoscopy;  Laterality: N/A;  . POLYPECTOMY N/A 04/29/2018   Procedure: POLYPECTOMY;  Surgeon: [name], MD;  Location: [place];  Service: Endoscopy;  Laterality: N/A;    Social History   Tobacco Use  . Smoking status: Never Smoker  . Smokeless tobacco: Never Used  Vaping Use  . Vaping Use: Never used  Substance Use Topics  . Alcohol use: Yes    Comment: socially  . Drug use: No     Medication list has been reviewed and updated.  Current Meds  Medication Sig  . Ascorbic Acid (VITA-C PO) Take  1 tablet by mouth daily.  Marland Kitchen aspirin 81 MG tablet Take 1 tablet by mouth daily.  . enalapril (VASOTEC) 10 MG tablet Take 1 tablet (10 mg total) by mouth daily.  . metFORMIN (GLUCOPHAGE-XR) 500 MG 24 hr tablet TAKE 1 TABLET BY MOUTH EVERY DAY WITH BREAKFAST  . Multiple Vitamins-Minerals (ZINC PO) Take 1 tablet by mouth daily.  . rosuvastatin (CRESTOR) 10 MG tablet Take 1 tablet (10 mg total) by mouth daily.  Marland Kitchen spironolactone (ALDACTONE) 25 MG tablet TAKE 1 TABLET BY MOUTH EVERY DAY  . VITAMIN D PO Take 1  tablet by mouth daily.  Marland Kitchen zinc gluconate 50 MG tablet Take 50 mg by mouth daily.    PHQ 2/9 Scores 09/18/2020 07/09/2020 04/16/2020 12/08/2019  PHQ - 2 Score 0 0 0 0  PHQ- 9 Score 0 0 0 0    GAD 7 : Generalized Anxiety Score 09/18/2020 07/09/2020 04/16/2020 12/08/2019  Nervous, Anxious, on Edge 0 0 0 0  Control/stop worrying 0 0 0 0  Worry too much - different things 0 0 0 0  Trouble relaxing 0 0 0 0  Restless 0 0 0 0  Easily annoyed or irritable 0 0 0 0  Afraid - awful might happen 0 0 0 0  Total GAD 7 Score 0 0 0 0  Anxiety Difficulty Not difficult at all Not difficult at all Not difficult at all Not difficult at all    BP Readings from Last 3 Encounters:  09/18/20 128/82  07/09/20 124/70  04/16/20 134/82    Physical Exam Vitals and nursing note reviewed.  Constitutional:      General: She is not in acute distress.    Appearance: She is well-developed.  HENT:     Head: Normocephalic and atraumatic.  Cardiovascular:     Rate and Rhythm: Normal rate and regular rhythm.     Pulses: Normal pulses.     Heart sounds: No murmur heard.   Pulmonary:     Effort: Pulmonary effort is normal. No respiratory distress.     Breath sounds: No wheezing or rhonchi.  Musculoskeletal:     Cervical back: Normal range of motion.     Right lower leg: No edema.     Left lower leg: No edema.  Lymphadenopathy:     Cervical: No cervical adenopathy.  Skin:    General: Skin is warm and dry.     Findings: No lesion or rash.  Neurological:     General: No focal deficit present.     Mental Status: She is alert and oriented to person, place, and time.  Psychiatric:        Mood and Affect: Mood normal.        Behavior: Behavior normal.     Wt Readings from Last 3 Encounters:  09/18/20 223 lb (101.2 kg)  07/09/20 222 lb (100.7 kg)  04/16/20 221 lb (100.2 kg)    BP 128/82   Pulse 75   Ht 5\' 5"  (1.651 m)   Wt 223 lb (101.2 kg)   SpO2 99%   BMI 37.11 kg/m   Assessment and Plan: 1.  Type II diabetes mellitus with complication (HCC) Clinically stable by exam and report without s/s of hypoglycemia. DM complicated by HTN. Tolerating medications well without side effects or other concerns. - POCT glycosylated hemoglobin (Hb A1C) = 5.9 Continue exercise and diet modifications with aim to lose weight  2. Need for vaccination for pneumococcus - Pneumococcal polysaccharide vaccine 23-valent greater than or  equal to 2yo subcutaneous/IM   Partially dictated using [employer]. Any errors are unintentional.  Halina Maidens, MD [facility]  09/18/2020

## 2020-11-27 LAB — HM DIABETES EYE EXAM

## 2021-01-18 ENCOUNTER — Ambulatory Visit: Payer: Managed Care, Other (non HMO) | Admitting: Internal Medicine

## 2021-01-25 ENCOUNTER — Ambulatory Visit: Payer: Managed Care, Other (non HMO) | Admitting: Internal Medicine

## 2021-01-30 ENCOUNTER — Ambulatory Visit: Payer: Managed Care, Other (non HMO) | Admitting: Internal Medicine

## 2021-01-31 ENCOUNTER — Ambulatory Visit: Payer: Managed Care, Other (non HMO) | Admitting: Internal Medicine

## 2021-02-27 ENCOUNTER — Ambulatory Visit: Payer: Managed Care, Other (non HMO) | Admitting: Internal Medicine

## 2021-03-13 ENCOUNTER — Ambulatory Visit
Admission: RE | Admit: 2021-03-13 | Discharge: 2021-03-13 | Disposition: A | Discharge status: Home / Self Care | Payer: Managed Care, Other (non HMO) | Source: Ambulatory Visit | Attending: Internal Medicine | Admitting: Internal Medicine

## 2021-03-13 ENCOUNTER — Other Ambulatory Visit: Payer: Self-pay

## 2021-03-13 ENCOUNTER — Encounter: Payer: Self-pay | Admitting: Internal Medicine

## 2021-03-13 ENCOUNTER — Ambulatory Visit: Payer: Managed Care, Other (non HMO) | Admitting: Internal Medicine

## 2021-03-13 VITALS — BP 136/84 | HR 77 | Temp 98.1°F | Ht 65.0 in | Wt 219.0 lb

## 2021-03-13 DIAGNOSIS — E118 Type 2 diabetes mellitus with unspecified complications: Secondary | ICD-10-CM | POA: Diagnosis not present

## 2021-03-13 DIAGNOSIS — Z1231 Encounter for screening mammogram for malignant neoplasm of breast: Secondary | ICD-10-CM | POA: Insufficient documentation

## 2021-03-13 DIAGNOSIS — I10 Essential (primary) hypertension: Secondary | ICD-10-CM

## 2021-03-13 DIAGNOSIS — Z23 Encounter for immunization: Secondary | ICD-10-CM

## 2021-03-13 LAB — POCT GLYCOSYLATED HEMOGLOBIN (HGB A1C): Hemoglobin A1C: 6.2 % — AB (ref 4.0–5.6)

## 2021-03-13 NOTE — Progress Notes (Signed)
Date:  03/13/2021   Name:  Molly Kelly   DOB:  February 12, 1956   MRN:  [id number]   Chief Complaint: Diabetes and Hypertension  Diabetes She presents for her follow-up diabetic visit. She has type 2 diabetes mellitus. Her disease course has been stable. Pertinent negatives for hypoglycemia include no headaches or tremors. Pertinent negatives for diabetes include no chest pain, no fatigue, no polydipsia and no polyuria. Current diabetic treatment includes oral agent (monotherapy). She is compliant with treatment all of the time. An ACE inhibitor/angiotensin II receptor blocker is being taken. Eye exam is not current.  Hypertension This is a chronic problem. The problem is controlled. Pertinent negatives include no chest pain, headaches, palpitations or shortness of breath. Past treatments include ACE inhibitors and diuretics. The current treatment provides significant improvement.   Lab Results  Component Value Date   CREATININE 0.68 04/16/2020   BUN 8 04/16/2020   NA 137 04/16/2020   K 4.6 04/16/2020   CL 102 04/16/2020   CO2 22 04/16/2020   Lab Results  Component Value Date   CHOL 174 04/16/2020   HDL 41 04/16/2020   LDLCALC 119 (H) 04/16/2020   TRIG 71 04/16/2020   CHOLHDL 4.2 04/16/2020   Lab Results  Component Value Date   TSH 1.190 04/16/2020   Lab Results  Component Value Date   HGBA1C 6.2 (A) 03/13/2021   Lab Results  Component Value Date   WBC 3.9 04/16/2020   HGB 13.1 04/16/2020   HCT 39.2 04/16/2020   MCV 94 04/16/2020   PLT 281 04/16/2020   Lab Results  Component Value Date   ALT 15 04/16/2020   AST 13 04/16/2020   ALKPHOS 63 04/16/2020   BILITOT 0.3 04/16/2020     Review of Systems  Constitutional:  Negative for appetite change, fatigue, fever and unexpected weight change.  HENT:  Negative for tinnitus and trouble swallowing.   Eyes:  Negative for visual disturbance.  Respiratory:  Negative for cough, chest tightness and shortness of breath.    Cardiovascular:  Negative for chest pain, palpitations and leg swelling.  Gastrointestinal:  Negative for abdominal pain.  Endocrine: Negative for polydipsia and polyuria.  Genitourinary:  Negative for dysuria and hematuria.  Musculoskeletal:  Negative for arthralgias.  Neurological:  Negative for tremors, numbness and headaches.  Psychiatric/Behavioral:  Negative for dysphoric mood.    Patient Active Problem List   Diagnosis Date Noted   Type II diabetes mellitus with complication ([place]) [id number]   Mixed hyperlipidemia 08/11/2018   Special screening for malignant neoplasms, colon    Benign neoplasm of transverse colon    Tubular adenoma of colon    Coronary artery disease involving native heart 05/02/2015   Gastroesophageal reflux disease 05/02/2015   Benign hypertension 05/02/2015    No Known Allergies  Past Surgical History:  Procedure Laterality Date   CARPAL TUNNEL RELEASE Bilateral    right - 14 yrs ago.  left - 06/2017   COLONOSCOPY WITH PROPOFOL N/A 04/29/2018   Procedure: COLONOSCOPY WITH BIOPSY;  Surgeon: [name], MD;  Location: [place];  Service: Endoscopy;  Laterality: N/A;   POLYPECTOMY N/A 04/29/2018   Procedure: POLYPECTOMY;  Surgeon: [name], MD;  Location: [place];  Service: Endoscopy;  Laterality: N/A;    Social History   Tobacco Use   Smoking status: Never   Smokeless tobacco: Never  Vaping Use   Vaping Use: Never used  Substance Use Topics   Alcohol use: Yes  Comment: socially   Drug use: No     Medication list has been reviewed and updated.  Current Meds  Medication Sig   Ascorbic Acid (VITA-C PO) Take 1 tablet by mouth daily.   aspirin 81 MG tablet Take 1 tablet by mouth daily.   enalapril (VASOTEC) 10 MG tablet Take 1 tablet (10 mg total) by mouth daily.   metFORMIN (GLUCOPHAGE-XR) 500 MG 24 hr tablet TAKE 1 TABLET BY MOUTH EVERY DAY WITH BREAKFAST   Multiple Vitamins-Minerals (ZINC PO) Take 1 tablet  by mouth daily.   rosuvastatin (CRESTOR) 10 MG tablet Take 1 tablet (10 mg total) by mouth daily.   spironolactone (ALDACTONE) 25 MG tablet TAKE 1 TABLET BY MOUTH EVERY DAY   VITAMIN D PO Take 1 tablet by mouth daily.   zinc gluconate 50 MG tablet Take 50 mg by mouth daily.    PHQ 2/9 Scores 03/13/2021 09/18/2020 07/09/2020 04/16/2020  PHQ - 2 Score 0 0 0 0  PHQ- 9 Score 0 0 0 0    GAD 7 : Generalized Anxiety Score 03/13/2021 09/18/2020 07/09/2020 04/16/2020  Nervous, Anxious, on Edge 0 0 0 0  Control/stop worrying 0 0 0 0  Worry too much - different things 0 0 0 0  Trouble relaxing 0 0 0 0  Restless 0 0 0 0  Easily annoyed or irritable 0 0 0 0  Afraid - awful might happen 0 0 0 0  Total GAD 7 Score 0 0 0 0  Anxiety Difficulty - Not difficult at all Not difficult at all Not difficult at all    BP Readings from Last 3 Encounters:  03/13/21 136/84  09/18/20 128/82  07/09/20 124/70    Physical Exam Vitals and nursing note reviewed.  Constitutional:      General: She is not in acute distress.    Appearance: She is well-developed.  HENT:     Head: Normocephalic and atraumatic.  Cardiovascular:     Rate and Rhythm: Normal rate and regular rhythm.     Pulses: Normal pulses.     Heart sounds: No murmur heard. Pulmonary:     Effort: Pulmonary effort is normal. No respiratory distress.     Breath sounds: No wheezing or rhonchi.  Musculoskeletal:     Cervical back: Normal range of motion.     Right lower leg: No edema.     Left lower leg: No edema.  Lymphadenopathy:     Cervical: No cervical adenopathy.  Skin:    General: Skin is warm and dry.     Findings: No rash.  Neurological:     General: No focal deficit present.     Mental Status: She is alert and oriented to person, place, and time.  Psychiatric:        Mood and Affect: Mood normal.        Behavior: Behavior normal.    Wt Readings from Last 3 Encounters:  03/13/21 219 lb (99.3 kg)  09/18/20 223 lb (101.2 kg)   07/09/20 222 lb (100.7 kg)    BP 136/84   Pulse 77   Temp 98.1 F (36.7 C) (Oral)   Ht '5\' 5"'$  (1.651 m)   Wt 219 lb (99.3 kg)   SpO2 100%   BMI 36.44 kg/m   Assessment and Plan: 1. Type II diabetes mellitus with complication (HCC) Clinically stable by exam and report without s/s of hypoglycemia. DM complicated by hypertension and dyslipidemia. Tolerating medications well without side effects or other concerns.  Resume regular exercise since A1C is slightly higher - POCT glycosylated hemoglobin (Hb A1C) = 6.2  2. Benign hypertension Clinically stable exam with well controlled BP. Tolerating medications without side effects at this time. Pt to continue current regimen and low sodium diet; benefits of regular exercise as able discussed.  3. Need for shingles vaccine First dose today - Varicella-zoster vaccine IM  4. Encounter for screening mammogram for breast cancer Schedule at [facility]   Partially dictated using Editor, commissioning. Any errors are unintentional.  Halina Maidens, MD [facility]  03/13/2021

## 2021-03-13 NOTE — Patient Instructions (Signed)
Resume regular exercise to lower A1C

## 2021-03-21 ENCOUNTER — Other Ambulatory Visit: Payer: Self-pay | Admitting: Internal Medicine

## 2021-03-21 DIAGNOSIS — E118 Type 2 diabetes mellitus with unspecified complications: Secondary | ICD-10-CM

## 2021-04-03 ENCOUNTER — Other Ambulatory Visit: Payer: Self-pay | Admitting: Internal Medicine

## 2021-04-03 DIAGNOSIS — I10 Essential (primary) hypertension: Secondary | ICD-10-CM

## 2021-04-17 ENCOUNTER — Other Ambulatory Visit: Payer: Self-pay | Admitting: Internal Medicine

## 2021-04-17 DIAGNOSIS — I10 Essential (primary) hypertension: Secondary | ICD-10-CM

## 2021-04-17 NOTE — Telephone Encounter (Signed)
Requested Prescriptions  Pending Prescriptions Disp Refills  . enalapril (VASOTEC) 10 MG tablet [Pharmacy Med Name: ENALAPRIL MALEATE 10 MG TAB] 90 tablet 1    Sig: TAKE 1 TABLET BY MOUTH EVERY DAY     Cardiovascular:  ACE Inhibitors Failed - 04/17/2021  1:33 AM      Failed - Cr in normal range and within 180 days    Creatinine, Ser  Date Value Ref Range Status  04/16/2020 0.68 0.57 - 1.00 mg/dL Final         Failed - K in normal range and within 180 days    Potassium  Date Value Ref Range Status  04/16/2020 4.6 3.5 - 5.2 mmol/L Final         Passed - Patient is not pregnant      Passed - Last BP in normal range    BP Readings from Last 1 Encounters:  03/13/21 136/84         Passed - Valid encounter within last 6 months    Recent Outpatient Visits          1 month ago Type II diabetes mellitus with complication [facility])   [facility] Glean Hess, MD   7 months ago Type II diabetes mellitus with complication [facility])   [facility] Glean Hess, MD   9 months ago [facility] Glean Hess, MD   1 year ago Annual physical exam   [facility] Glean Hess, MD   1 year ago Type II diabetes mellitus with complication [facility])   [facility] Glean Hess, MD      Future Appointments            In 2 days Glean Hess, MD [facility]

## 2021-04-19 ENCOUNTER — Ambulatory Visit (INDEPENDENT_AMBULATORY_CARE_PROVIDER_SITE_OTHER): Payer: Managed Care, Other (non HMO) | Admitting: Internal Medicine

## 2021-04-19 ENCOUNTER — Other Ambulatory Visit: Payer: Self-pay

## 2021-04-19 ENCOUNTER — Encounter: Payer: Self-pay | Admitting: Internal Medicine

## 2021-04-19 ENCOUNTER — Other Ambulatory Visit (HOSPITAL_COMMUNITY)
Admission: RE | Admit: 2021-04-19 | Discharge: 2021-04-19 | Disposition: A | Discharge status: Home / Self Care | Payer: Managed Care, Other (non HMO) | Source: Ambulatory Visit | Attending: Internal Medicine | Admitting: Internal Medicine

## 2021-04-19 VITALS — BP 134/88 | HR 83 | Temp 98.1°F | Resp 17 | Ht 65.0 in | Wt 218.6 lb

## 2021-04-19 DIAGNOSIS — Z124 Encounter for screening for malignant neoplasm of cervix: Secondary | ICD-10-CM | POA: Diagnosis not present

## 2021-04-19 DIAGNOSIS — I10 Essential (primary) hypertension: Secondary | ICD-10-CM

## 2021-04-19 DIAGNOSIS — E118 Type 2 diabetes mellitus with unspecified complications: Secondary | ICD-10-CM

## 2021-04-19 DIAGNOSIS — E1169 Type 2 diabetes mellitus with other specified complication: Secondary | ICD-10-CM

## 2021-04-19 DIAGNOSIS — Z0000 Encounter for general adult medical examination without abnormal findings: Secondary | ICD-10-CM | POA: Diagnosis not present

## 2021-04-19 DIAGNOSIS — E785 Hyperlipidemia, unspecified: Secondary | ICD-10-CM | POA: Diagnosis not present

## 2021-04-19 LAB — POCT URINALYSIS DIPSTICK
Bilirubin, UA: NEGATIVE
Blood, UA: NEGATIVE
Glucose, UA: NEGATIVE
Ketones, UA: NEGATIVE
Leukocytes, UA: NEGATIVE
Nitrite, UA: NEGATIVE
Protein, UA: NEGATIVE
Spec Grav, UA: 1.025 (ref 1.010–1.025)
Urobilinogen, UA: 0.2 E.U./dL
pH, UA: 5 (ref 5.0–8.0)

## 2021-04-19 MED ORDER — ROSUVASTATIN CALCIUM 10 MG PO TABS: 10.0000 mg | ORAL_TABLET | Freq: Every day | ORAL | 3 refills | Status: DC

## 2021-04-19 NOTE — Progress Notes (Signed)
Date:  04/19/2021   Name:  Molly Kelly   DOB:  [birth year]   MRN:  [id number]   Chief Complaint: Annual Exam Molly Kelly is a 65 y.o. female who presents today for her Complete Annual Exam. She feels well. She reports exercising walking 3- 4 times a week about 2 miles. She reports she is sleeping no difficulty falling asleep and staying asleep. Breast complaints none.  Mammogram: 02/2021 DEXA: none Pap smear: 01/2016 neg with co-testing Colonoscopy: 03/2018  Immunization History  Administered Date(s) Administered   Influenza Inj Mdck Quad Pf 04/03/2017   Influenza,inj,Quad PF,6+ Mos 04/13/2018, 04/16/2020, 03/13/2021   Influenza-Unspecified 06/29/2016, 04/28/2019   PFIZER(Purple Top)SARS-COV-2 Vaccination 08/23/2019, 09/13/2019, 05/04/2020   Pneumococcal Polysaccharide-23 09/18/2020   Tdap 09/18/2011   Zoster Recombinat (Shingrix) 03/13/2021    Hypertension This is a chronic problem. The problem is controlled. Pertinent negatives include no chest pain, headaches, palpitations or shortness of breath. Past treatments include ACE inhibitors.  Hyperlipidemia This is a chronic problem. The problem is controlled. Pertinent negatives include no chest pain or shortness of breath. Current antihyperlipidemic treatment includes statins. The current treatment provides significant improvement of lipids.   Lab Results  Component Value Date   CREATININE 0.68 04/16/2020   BUN 8 04/16/2020   NA 137 04/16/2020   K 4.6 04/16/2020   CL 102 04/16/2020   CO2 22 04/16/2020   Lab Results  Component Value Date   CHOL 174 04/16/2020   HDL 41 04/16/2020   LDLCALC 119 (H) 04/16/2020   TRIG 71 04/16/2020   CHOLHDL 4.2 04/16/2020   Lab Results  Component Value Date   TSH 1.190 04/16/2020   Lab Results  Component Value Date   HGBA1C 6.2 (A) 03/13/2021   Lab Results  Component Value Date   WBC 3.9 04/16/2020   HGB 13.1 04/16/2020   HCT 39.2 04/16/2020   MCV 94 04/16/2020    PLT 281 04/16/2020   Lab Results  Component Value Date   ALT 15 04/16/2020   AST 13 04/16/2020   ALKPHOS 63 04/16/2020   BILITOT 0.3 04/16/2020     Review of Systems  Constitutional:  Negative for chills, fatigue and fever.  HENT:  Negative for congestion, hearing loss, tinnitus, trouble swallowing and voice change.   Eyes:  Negative for visual disturbance.  Respiratory:  Negative for cough, chest tightness, shortness of breath and wheezing.   Cardiovascular:  Negative for chest pain, palpitations and leg swelling.  Gastrointestinal:  Negative for abdominal pain, constipation, diarrhea and vomiting.  Endocrine: Negative for polydipsia and polyuria.  Genitourinary:  Negative for dysuria, frequency, genital sores, vaginal bleeding and vaginal discharge.  Musculoskeletal:  Negative for arthralgias, gait problem and joint swelling.  Skin:  Negative for color change and rash.  Neurological:  Negative for dizziness, tremors, light-headedness and headaches.  Hematological:  Negative for adenopathy. Does not bruise/bleed easily.  Psychiatric/Behavioral:  Negative for dysphoric mood and sleep disturbance. The patient is not nervous/anxious.    Patient Active Problem List   Diagnosis Date Noted   Type II diabetes mellitus with complication ([place]) 25/95/6387   Mixed hyperlipidemia 08/11/2018   Special screening for malignant neoplasms, colon    Benign neoplasm of transverse colon    Tubular adenoma of colon    Coronary artery disease involving native heart 05/02/2015   Gastroesophageal reflux disease 05/02/2015   Benign hypertension 05/02/2015    No Known Allergies  Past Surgical History:  Procedure Laterality Date  CARPAL TUNNEL RELEASE Bilateral    right - 14 yrs ago.  left - 06/2017   COLONOSCOPY WITH PROPOFOL N/A 04/29/2018   Procedure: COLONOSCOPY WITH BIOPSY;  Surgeon: [name], MD;  Location: [place];  Service: Endoscopy;  Laterality: N/A;   POLYPECTOMY N/A  04/29/2018   Procedure: POLYPECTOMY;  Surgeon: [name], MD;  Location: [place];  Service: Endoscopy;  Laterality: N/A;    Social History   Tobacco Use   Smoking status: Never   Smokeless tobacco: Never  Vaping Use   Vaping Use: Never used  Substance Use Topics   Alcohol use: Yes    Comment: socially   Drug use: No     Medication list has been reviewed and updated.  Current Meds  Medication Sig   Ascorbic Acid (VITA-C PO) Take 1 tablet by mouth daily.   aspirin 81 MG tablet Take 1 tablet by mouth daily.   enalapril (VASOTEC) 10 MG tablet TAKE 1 TABLET BY MOUTH EVERY DAY   metFORMIN (GLUCOPHAGE-XR) 500 MG 24 hr tablet TAKE 1 TABLET BY MOUTH EVERY DAY WITH BREAKFAST   Multiple Vitamins-Minerals (ZINC PO) Take 1 tablet by mouth daily.   spironolactone (ALDACTONE) 25 MG tablet TAKE 1 TABLET BY MOUTH EVERY DAY   VITAMIN D PO Take 1 tablet by mouth daily.   zinc gluconate 50 MG tablet Take 50 mg by mouth daily.   [DISCONTINUED] rosuvastatin (CRESTOR) 10 MG tablet Take 1 tablet (10 mg total) by mouth daily.    PHQ 2/9 Scores 04/19/2021 03/13/2021 09/18/2020 07/09/2020  PHQ - 2 Score 0 0 0 0  PHQ- 9 Score 0 0 0 0    GAD 7 : Generalized Anxiety Score 04/19/2021 03/13/2021 09/18/2020 07/09/2020  Nervous, Anxious, on Edge 0 0 0 0  Control/stop worrying 0 0 0 0  Worry too much - different things 0 0 0 0  Trouble relaxing 0 0 0 0  Restless 0 0 0 0  Easily annoyed or irritable 0 0 0 0  Afraid - awful might happen 0 0 0 0  Total GAD 7 Score 0 0 0 0  Anxiety Difficulty Not difficult at all - Not difficult at all Not difficult at all    BP Readings from Last 3 Encounters:  04/19/21 134/88  03/13/21 136/84  09/18/20 128/82    Physical Exam Vitals and nursing note reviewed.  Constitutional:      General: She is not in acute distress.    Appearance: She is well-developed.  HENT:     Head: Normocephalic and atraumatic.     Right Ear: Tympanic membrane and ear canal  normal.     Left Ear: Tympanic membrane and ear canal normal.     Nose:     Right Sinus: No maxillary sinus tenderness.     Left Sinus: No maxillary sinus tenderness.  Eyes:     General: No scleral icterus.       Right eye: No discharge.        Left eye: No discharge.     Conjunctiva/sclera: Conjunctivae normal.  Neck:     Thyroid: No thyromegaly.     Vascular: No carotid bruit.  Cardiovascular:     Rate and Rhythm: Normal rate and regular rhythm.     Pulses: Normal pulses.     Heart sounds: Normal heart sounds.  Pulmonary:     Effort: Pulmonary effort is normal. No respiratory distress.     Breath sounds: No wheezing.  Chest:  Breasts:    Right: No mass, nipple discharge, skin change or tenderness.     Left: No mass, nipple discharge, skin change or tenderness.  Abdominal:     General: Bowel sounds are normal.     Palpations: Abdomen is soft.     Tenderness: There is no abdominal tenderness.  Musculoskeletal:     Cervical back: Normal range of motion. No erythema.     Right lower leg: No edema.     Left lower leg: No edema.  Lymphadenopathy:     Cervical: No cervical adenopathy.  Skin:    General: Skin is warm and dry.     Findings: No rash.  Neurological:     Mental Status: She is alert and oriented to person, place, and time.     Cranial Nerves: No cranial nerve deficit.     Sensory: No sensory deficit.     Deep Tendon Reflexes: Reflexes are normal and symmetric.  Psychiatric:        Attention and Perception: Attention normal.        Mood and Affect: Mood normal.    Wt Readings from Last 3 Encounters:  04/19/21 218 lb 9.6 oz (99.2 kg)  03/13/21 219 lb (99.3 kg)  09/18/20 223 lb (101.2 kg)    BP 134/88 (BP Location: Right Arm, Patient Position: Sitting, Cuff Size: Large)   Pulse 83   Temp 98.1 F (36.7 C) (Oral)   Resp 17   Ht 5\' 5"  (1.651 m)   Wt 218 lb 9.6 oz (99.2 kg)   SpO2 98%   BMI 36.38 kg/m   Assessment and Plan: 1. Annual physical exam Exam  is normal except for weight. Encourage regular exercise and appropriate dietary changes.  Even a few pounds will benefit DM. Screenings and immunizations are up to date.  Will get second Shingrix next visit.  2. Benign hypertension Clinically stable exam with well controlled BP. Tolerating medications without side effects at this time. Pt to continue current regimen and low sodium diet; benefits of regular exercise as able discussed. - CBC with Differential/Platelet - Comprehensive metabolic panel - POCT urinalysis dipstick - TSH  3. Hyperlipidemia associated with type 2 diabetes mellitus ([place]) Tolerating statin medication without side effects at this time LDL is at goal of < 70 on current dose Continue same therapy without change at this time. - Lipid panel - rosuvastatin (CRESTOR) 10 MG tablet; Take 1 tablet (10 mg total) by mouth daily.  Dispense: 90 tablet; Refill: 3  4. Encounter for screening for cervical cancer Obtained today - if normal, we can discontinue routine screenings - Cytology - PAP  5. Type II diabetes mellitus with complication (HCC) Well controlled on metformin. Last A1C 6.2 up from the nadir of 5.9. Continue current medication and efforts at weight loss   Partially dictated using Editor, commissioning. Any errors are unintentional.  Halina Maidens, MD [facility]  04/19/2021

## 2021-04-20 LAB — CBC WITH DIFFERENTIAL/PLATELET
Basophils Absolute: 0 10*3/uL (ref 0.0–0.2)
Basos: 1 %
EOS (ABSOLUTE): 0.1 10*3/uL (ref 0.0–0.4)
Eos: 2 %
Hematocrit: 41.3 % (ref 34.0–46.6)
Hemoglobin: 13.8 g/dL (ref 11.1–15.9)
Immature Grans (Abs): 0 10*3/uL (ref 0.0–0.1)
Immature Granulocytes: 0 %
Lymphocytes Absolute: 1.9 10*3/uL (ref 0.7–3.1)
Lymphs: 41 %
MCH: 30.9 pg (ref 26.6–33.0)
MCHC: 33.4 g/dL (ref 31.5–35.7)
MCV: 92 fL (ref 79–97)
Monocytes Absolute: 0.5 10*3/uL (ref 0.1–0.9)
Monocytes: 10 %
Neutrophils Absolute: 2.1 10*3/uL (ref 1.4–7.0)
Neutrophils: 46 %
Platelets: 290 10*3/uL (ref 150–450)
RBC: 4.47 x10E6/uL (ref 3.77–5.28)
RDW: 12.3 % (ref 11.7–15.4)
WBC: 4.6 10*3/uL (ref 3.4–10.8)

## 2021-04-20 LAB — COMPREHENSIVE METABOLIC PANEL
ALT: 13 IU/L (ref 0–32)
AST: 15 IU/L (ref 0–40)
Albumin/Globulin Ratio: 2 (ref 1.2–2.2)
Albumin: 4.9 g/dL — ABNORMAL HIGH (ref 3.8–4.8)
Alkaline Phosphatase: 63 IU/L (ref 44–121)
BUN/Creatinine Ratio: 11 — ABNORMAL LOW (ref 12–28)
BUN: 8 mg/dL (ref 8–27)
Bilirubin Total: 0.5 mg/dL (ref 0.0–1.2)
CO2: 23 mmol/L (ref 20–29)
Calcium: 10.8 mg/dL — ABNORMAL HIGH (ref 8.7–10.3)
Chloride: 102 mmol/L (ref 96–106)
Creatinine, Ser: 0.7 mg/dL (ref 0.57–1.00)
Globulin, Total: 2.5 g/dL (ref 1.5–4.5)
Glucose: 102 mg/dL — ABNORMAL HIGH (ref 70–99)
Potassium: 4.5 mmol/L (ref 3.5–5.2)
Sodium: 140 mmol/L (ref 134–144)
Total Protein: 7.4 g/dL (ref 6.0–8.5)
eGFR: 97 mL/min/{1.73_m2} (ref >59)

## 2021-04-20 LAB — LIPID PANEL
Chol/HDL Ratio: 4.8 ratio — ABNORMAL HIGH (ref 0.0–4.4)
Cholesterol, Total: 181 mg/dL (ref 100–199)
HDL: 38 mg/dL — ABNORMAL LOW (ref >39)
LDL Chol Calc (NIH): 123 mg/dL — ABNORMAL HIGH (ref 0–99)
Triglycerides: 109 mg/dL (ref 0–149)
VLDL Cholesterol Cal: 20 mg/dL (ref 5–40)

## 2021-04-20 LAB — TSH: TSH: 1.61 u[IU]/mL (ref 0.450–4.500)

## 2021-04-23 LAB — CYTOLOGY - PAP
Comment: NEGATIVE
Diagnosis: NEGATIVE
High risk HPV: NEGATIVE

## 2021-04-30 ENCOUNTER — Encounter: Payer: Self-pay | Admitting: Internal Medicine

## 2021-06-30 ENCOUNTER — Other Ambulatory Visit: Payer: Self-pay | Admitting: Internal Medicine

## 2021-06-30 DIAGNOSIS — I10 Essential (primary) hypertension: Secondary | ICD-10-CM

## 2021-07-02 NOTE — Telephone Encounter (Signed)
Requested Prescriptions  Pending Prescriptions Disp Refills   spironolactone (ALDACTONE) 25 MG tablet [Pharmacy Med Name: SPIRONOLACTONE 25 MG TABLET] 90 tablet 0    Sig: TAKE 1 TABLET BY MOUTH EVERY DAY     Cardiovascular: Diuretics - Aldosterone Antagonist Passed - 06/30/2021  1:55 AM      Passed - Cr in normal range and within 360 days    Creatinine, Ser  Date Value Ref Range Status  04/19/2021 0.70 0.57 - 1.00 mg/dL Final         Passed - K in normal range and within 360 days    Potassium  Date Value Ref Range Status  04/19/2021 4.5 3.5 - 5.2 mmol/L Final         Passed - Na in normal range and within 360 days    Sodium  Date Value Ref Range Status  04/19/2021 140 134 - 144 mmol/L Final         Passed - Last BP in normal range    BP Readings from Last 1 Encounters:  04/19/21 134/88         Passed - Valid encounter within last 6 months    Recent Outpatient Visits          2 months ago Annual physical exam   [facility] Glean Hess, MD   3 months ago Type II diabetes mellitus with complication [facility])   [facility] Glean Hess, MD   9 months ago Type II diabetes mellitus with complication [facility])   [facility] Glean Hess, MD   11 months ago [facility] Glean Hess, MD   1 year ago Annual physical exam   [facility] Glean Hess, MD      Future Appointments            In 1 month Army Melia Jesse Sans, MD [facility], [place]   In 9 months Army Melia, Jesse Sans, MD [facility], [facility]

## 2021-08-06 ENCOUNTER — Ambulatory Visit: Payer: Managed Care, Other (non HMO) | Admitting: Internal Medicine

## 2021-08-12 ENCOUNTER — Other Ambulatory Visit: Payer: Self-pay

## 2021-08-12 ENCOUNTER — Ambulatory Visit: Payer: Managed Care, Other (non HMO) | Admitting: Internal Medicine

## 2021-08-12 ENCOUNTER — Encounter: Payer: Self-pay | Admitting: Internal Medicine

## 2021-08-12 VITALS — BP 118/76 | HR 89 | Ht 65.0 in | Wt 211.0 lb

## 2021-08-12 DIAGNOSIS — I10 Essential (primary) hypertension: Secondary | ICD-10-CM

## 2021-08-12 DIAGNOSIS — Z23 Encounter for immunization: Secondary | ICD-10-CM

## 2021-08-12 DIAGNOSIS — E118 Type 2 diabetes mellitus with unspecified complications: Secondary | ICD-10-CM

## 2021-08-12 DIAGNOSIS — E210 Primary hyperparathyroidism: Secondary | ICD-10-CM | POA: Insufficient documentation

## 2021-08-12 DIAGNOSIS — T8339XA Other mechanical complication of intrauterine contraceptive device, initial encounter: Secondary | ICD-10-CM

## 2021-08-12 NOTE — Progress Notes (Signed)
Date:  08/12/2021   Name:  Molly Kelly   DOB:  08-Apr-1956   MRN:  [id number]   Chief Complaint: Diabetes  Diabetes She presents for her follow-up diabetic visit. She has type 2 diabetes mellitus. Her disease course has been stable. Pertinent negatives for hypoglycemia include no headaches or tremors. Pertinent negatives for diabetes include no chest pain, no fatigue, no polydipsia and no polyuria. Symptoms are stable. Pertinent negatives for diabetic complications include no CVA. Current diabetic treatment includes oral agent (monotherapy). She is compliant with treatment all of the time. She participates in exercise three times a week. An ACE inhibitor/angiotensin II receptor blocker is being taken.  Hypertension This is a chronic problem. The problem is controlled. Pertinent negatives include no chest pain, headaches, palpitations or shortness of breath. Past treatments include ACE inhibitors. The current treatment provides significant improvement. There are no compliance problems.  There is no history of kidney disease, CAD/MI or CVA.  Elevated serum Calcium - has been elevated for several years but on PTH has not been done. She feels well, no fractures, no muscle cramps. She has started going to the gyn and feels well.   Lab Results  Component Value Date   NA 140 04/19/2021   K 4.5 04/19/2021   CO2 23 04/19/2021   GLUCOSE 102 (H) 04/19/2021   BUN 8 04/19/2021   CREATININE 0.70 04/19/2021   CALCIUM 10.8 (H) 04/19/2021   EGFR 97 04/19/2021   GFRNONAA 94 04/16/2020   Lab Results  Component Value Date   CHOL 181 04/19/2021   HDL 38 (L) 04/19/2021   LDLCALC 123 (H) 04/19/2021   TRIG 109 04/19/2021   CHOLHDL 4.8 (H) 04/19/2021   Lab Results  Component Value Date   TSH 1.610 04/19/2021   Lab Results  Component Value Date   HGBA1C 6.2 (A) 03/13/2021   Lab Results  Component Value Date   WBC 4.6 04/19/2021   HGB 13.8 04/19/2021   HCT 41.3 04/19/2021   MCV 92  04/19/2021   PLT 290 04/19/2021   Lab Results  Component Value Date   ALT 13 04/19/2021   AST 15 04/19/2021   ALKPHOS 63 04/19/2021   BILITOT 0.5 04/19/2021   No results found for: 25OHVITD2, 25OHVITD3, VD25OH   Review of Systems  Constitutional:  Negative for appetite change, fatigue, fever and unexpected weight change.  HENT:  Negative for tinnitus and trouble swallowing.   Eyes:  Negative for visual disturbance.  Respiratory:  Negative for cough, chest tightness and shortness of breath.   Cardiovascular:  Negative for chest pain, palpitations and leg swelling.  Gastrointestinal:  Negative for abdominal pain.  Endocrine: Negative for polydipsia and polyuria.  Genitourinary:  Negative for dysuria and hematuria.  Musculoskeletal:  Negative for arthralgias.  Neurological:  Negative for tremors, numbness and headaches.  Psychiatric/Behavioral:  Negative for dysphoric mood and sleep disturbance.    Patient Active Problem List   Diagnosis Date Noted   Retained intrauterine contraceptive device (IUD) 08/12/2021   Type II diabetes mellitus with complication ([place]) 66/44/0347   Mixed hyperlipidemia 08/11/2018   Special screening for malignant neoplasms, colon    Benign neoplasm of transverse colon    Tubular adenoma of colon    Coronary artery disease involving native heart 05/02/2015   Gastroesophageal reflux disease 05/02/2015   Benign hypertension 05/02/2015    No Known Allergies  Past Surgical History:  Procedure Laterality Date   CARPAL TUNNEL RELEASE Bilateral    right -  14 yrs ago.  left - 06/2017   COLONOSCOPY WITH PROPOFOL N/A 04/29/2018   Procedure: COLONOSCOPY WITH BIOPSY;  Surgeon: [name], MD;  Location: [place];  Service: Endoscopy;  Laterality: N/A;   POLYPECTOMY N/A 04/29/2018   Procedure: POLYPECTOMY;  Surgeon: [name], MD;  Location: [place];  Service: Endoscopy;  Laterality: N/A;    Social History   Tobacco Use    Smoking status: Never   Smokeless tobacco: Never  Vaping Use   Vaping Use: Never used  Substance Use Topics   Alcohol use: Yes    Comment: socially   Drug use: No     Medication list has been reviewed and updated.  Current Meds  Medication Sig   Ascorbic Acid (VITA-C PO) Take 1 tablet by mouth daily.   aspirin 81 MG tablet Take 1 tablet by mouth daily.   enalapril (VASOTEC) 10 MG tablet TAKE 1 TABLET BY MOUTH EVERY DAY   metFORMIN (GLUCOPHAGE-XR) 500 MG 24 hr tablet TAKE 1 TABLET BY MOUTH EVERY DAY WITH BREAKFAST   Multiple Vitamins-Minerals (ZINC PO) Take 1 tablet by mouth daily.   rosuvastatin (CRESTOR) 10 MG tablet Take 1 tablet (10 mg total) by mouth daily.   spironolactone (ALDACTONE) 25 MG tablet TAKE 1 TABLET BY MOUTH EVERY DAY   VITAMIN D PO Take 1 tablet by mouth daily.   zinc gluconate 50 MG tablet Take 50 mg by mouth daily.    PHQ 2/9 Scores 08/12/2021 04/19/2021 03/13/2021 09/18/2020  PHQ - 2 Score 0 0 0 0  PHQ- 9 Score 3 0 0 0    GAD 7 : Generalized Anxiety Score 08/12/2021 04/19/2021 03/13/2021 09/18/2020  Nervous, Anxious, on Edge 0 0 0 0  Control/stop worrying 0 0 0 0  Worry too much - different things 0 0 0 0  Trouble relaxing 0 0 0 0  Restless 0 0 0 0  Easily annoyed or irritable 0 0 0 0  Afraid - awful might happen 0 0 0 0  Total GAD 7 Score 0 0 0 0  Anxiety Difficulty - Not difficult at all - Not difficult at all    BP Readings from Last 3 Encounters:  08/12/21 118/76  04/19/21 134/88  03/13/21 136/84    Physical Exam Vitals and nursing note reviewed.  Constitutional:      General: She is not in acute distress.    Appearance: She is well-developed. She is obese.  HENT:     Head: Normocephalic and atraumatic.  Neck:     Thyroid: No thyroid mass.  Cardiovascular:     Rate and Rhythm: Normal rate and regular rhythm.     Pulses: Normal pulses.     Heart sounds: No murmur heard. Pulmonary:     Effort: Pulmonary effort is normal. No respiratory  distress.     Breath sounds: Normal breath sounds and air entry. No wheezing or rhonchi.  Musculoskeletal:     Cervical back: Normal range of motion.  Lymphadenopathy:     Cervical: No cervical adenopathy.  Skin:    General: Skin is warm and dry.     Findings: No rash.  Neurological:     Mental Status: She is alert and oriented to person, place, and time.  Psychiatric:        Mood and Affect: Mood normal.        Behavior: Behavior normal.    Wt Readings from Last 3 Encounters:  08/12/21 211 lb (95.7 kg)  04/19/21  218 lb 9.6 oz (99.2 kg)  03/13/21 219 lb (99.3 kg)    BP 118/76 (BP Location: Right Arm, Cuff Size: Large)    Pulse 89    Ht 5' 5" (1.651 m)    Wt 211 lb (95.7 kg)    SpO2 97%    BMI 35.11 kg/m   Assessment and Plan: 1. Type II diabetes mellitus with complication (HCC) Clinically stable by exam and report without s/s of hypoglycemia. DM complicated by hypertension and dyslipidemia. She is working on diet and exercise and has lost 7 lbs. Tolerating medications well without side effects or other concerns. - Hemoglobin A1c - Comprehensive metabolic panel - Microalbumin / creatinine urine ratio  2. Serum calcium elevated Will recheck calcium and PTH Stop any calcium supplements - Parathyroid hormone, intact (no Ca)  3. Benign hypertension Clinically stable exam with well controlled BP. Tolerating medications without side effects at this time. Pt to continue current regimen and low sodium diet; benefits of regular exercise as able discussed.  4. Retained intrauterine contraceptive device (IUD) This was evaluated by GYN - unable to remove it due to being embedded. She was told this should not cause her any issues. She denies pelvic pain, discharge and bleeding.   Partially dictated using Editor, commissioning. Any errors are unintentional.  Halina Maidens, MD [facility]  08/12/2021

## 2021-08-13 ENCOUNTER — Encounter: Payer: Self-pay | Admitting: Internal Medicine

## 2021-08-13 LAB — COMPREHENSIVE METABOLIC PANEL
ALT: 15 IU/L (ref 0–32)
AST: 17 IU/L (ref 0–40)
Albumin/Globulin Ratio: 1.9 (ref 1.2–2.2)
Albumin: 4.9 g/dL — ABNORMAL HIGH (ref 3.8–4.8)
Alkaline Phosphatase: 60 IU/L (ref 44–121)
BUN/Creatinine Ratio: 14 (ref 12–28)
BUN: 9 mg/dL (ref 8–27)
Bilirubin Total: 0.4 mg/dL (ref 0.0–1.2)
CO2: 22 mmol/L (ref 20–29)
Calcium: 10.8 mg/dL — ABNORMAL HIGH (ref 8.7–10.3)
Chloride: 105 mmol/L (ref 96–106)
Creatinine, Ser: 0.65 mg/dL (ref 0.57–1.00)
Globulin, Total: 2.6 g/dL (ref 1.5–4.5)
Glucose: 102 mg/dL — ABNORMAL HIGH (ref 70–99)
Potassium: 4.8 mmol/L (ref 3.5–5.2)
Sodium: 141 mmol/L (ref 134–144)
Total Protein: 7.5 g/dL (ref 6.0–8.5)
eGFR: 99 mL/min/{1.73_m2} (ref >59)

## 2021-08-13 LAB — HEMOGLOBIN A1C
Est. average glucose Bld gHb Est-mCnc: 128 mg/dL
Hgb A1c MFr Bld: 6.1 % — ABNORMAL HIGH (ref 4.8–5.6)

## 2021-08-13 LAB — PARATHYROID HORMONE, INTACT (NO CA): PTH: 52 pg/mL (ref 15–65)

## 2021-08-13 LAB — MICROALBUMIN / CREATININE URINE RATIO
Creatinine, Urine: 229.4 mg/dL
Microalb/Creat Ratio: 32 mg/g creat — ABNORMAL HIGH (ref 0–29)
Microalbumin, Urine: 74.3 ug/mL

## 2021-09-15 ENCOUNTER — Other Ambulatory Visit: Payer: Self-pay | Admitting: Internal Medicine

## 2021-09-15 DIAGNOSIS — E118 Type 2 diabetes mellitus with unspecified complications: Secondary | ICD-10-CM

## 2021-09-17 NOTE — Telephone Encounter (Signed)
Requested Prescriptions  ?Pending Prescriptions Disp Refills  ?? metFORMIN (GLUCOPHAGE-XR) 500 MG 24 hr tablet [Pharmacy Med Name: METFORMIN HCL ER 500 MG TABLET] 90 tablet 1  ?  Sig: TAKE 1 TABLET BY MOUTH EVERY DAY WITH BREAKFAST  ?  ? Endocrinology:  Diabetes - Biguanides Failed - 09/15/2021  9:39 AM  ?  ?  Failed - B12 Level in normal range and within 720 days  ?  No results found for: VITAMINB12   ?  ?  Passed - Cr in normal range and within 360 days  ?  Creatinine, Ser  ?Date Value Ref Range Status  ?08/12/2021 0.65 0.57 - 1.00 mg/dL Final  ?   ?  ?  Passed - HBA1C is between 0 and 7.9 and within 180 days  ?  Hgb A1c MFr Bld  ?Date Value Ref Range Status  ?08/12/2021 6.1 (H) 4.8 - 5.6 % Final  ?  Comment:  ?           Prediabetes: 5.7 - 6.4 ?         Diabetes: >6.4 ?         Glycemic control for adults with diabetes: <7.0 ?  ?   ?  ?  Passed - eGFR in normal range and within 360 days  ?  GFR calc Af Amer  ?Date Value Ref Range Status  ?04/16/2020 108 >59 mL/min/1.73 Final  ?  Comment:  ?  **In accordance with recommendations from the NKF-ASN Task force,** ?  [facility] is in the process of updating its eGFR calculation to the ?  2021 CKD-EPI creatinine equation that estimates kidney function ?  without a race variable. ?  ? ?GFR calc non Af Amer  ?Date Value Ref Range Status  ?04/16/2020 94 >59 mL/min/1.73 Final  ? ?eGFR  ?Date Value Ref Range Status  ?08/12/2021 99 >59 mL/min/1.73 Final  ?   ?  ?  Passed - Valid encounter within last 6 months  ?  Recent Outpatient Visits   ?      ? 1 month ago Type II diabetes mellitus with complication ([place])  ? [facility] Glean Hess, MD  ? 5 months ago Annual physical exam  ? [facility] Glean Hess, MD  ? 6 months ago Type II diabetes mellitus with complication [facility])  ? [facility] Glean Hess, MD  ? 12 months ago Type II diabetes mellitus with complication [facility])  ? [facility] Glean Hess, MD  ? 1 year ago  Bronchitis  ? [facility] Glean Hess, MD  ?  ?  ?Future Appointments   ?        ? In 2 months Army Melia Jesse Sans, MD [facility], [place]  ? In 7 months Glean Hess, MD [facility], [facility]  ?  ? ?  ?  ?  Passed - CBC within normal limits and completed in the last 12 months  ?  WBC  ?Date Value Ref Range Status  ?04/19/2021 4.6 3.4 - 10.8 x10E3/uL Final  ? ?RBC  ?Date Value Ref Range Status  ?04/19/2021 4.47 3.77 - 5.28 x10E6/uL Final  ? ?Hemoglobin  ?Date Value Ref Range Status  ?04/19/2021 13.8 11.1 - 15.9 g/dL Final  ? ?Hematocrit  ?Date Value Ref Range Status  ?04/19/2021 41.3 34.0 - 46.6 % Final  ? ?MCHC  ?Date Value Ref Range Status  ?04/19/2021 33.4 31.5 - 35.7 g/dL Final  ? ?  MCH  ?Date Value Ref Range Status  ?04/19/2021 30.9 26.6 - 33.0 pg Final  ? ?MCV  ?Date Value Ref Range Status  ?04/19/2021 92 79 - 97 fL Final  ? ?No results found for: PLTCOUNTKUC, LABPLAT, [place] ?RDW  ?Date Value Ref Range Status  ?04/19/2021 12.3 11.7 - 15.4 % Final  ? ?  ?  ?  ? ?

## 2021-09-22 ENCOUNTER — Other Ambulatory Visit: Payer: Self-pay

## 2021-09-22 ENCOUNTER — Ambulatory Visit
Admission: EM | Admit: 2021-09-22 | Discharge: 2021-09-22 | Disposition: A | Discharge status: Home / Self Care | Payer: Managed Care, Other (non HMO) | Attending: Physician Assistant | Admitting: Physician Assistant

## 2021-09-22 DIAGNOSIS — W19XXXA Unspecified fall, initial encounter: Secondary | ICD-10-CM

## 2021-09-22 DIAGNOSIS — J101 Influenza due to other identified influenza virus with other respiratory manifestations: Secondary | ICD-10-CM | POA: Diagnosis not present

## 2021-09-22 DIAGNOSIS — Z79899 Other long term (current) drug therapy: Secondary | ICD-10-CM | POA: Diagnosis not present

## 2021-09-22 DIAGNOSIS — Z20822 Contact with and (suspected) exposure to covid-19: Secondary | ICD-10-CM | POA: Diagnosis not present

## 2021-09-22 DIAGNOSIS — W1830XA Fall on same level, unspecified, initial encounter: Secondary | ICD-10-CM | POA: Diagnosis not present

## 2021-09-22 DIAGNOSIS — R0981 Nasal congestion: Secondary | ICD-10-CM | POA: Insufficient documentation

## 2021-09-22 LAB — RESP PANEL BY RT-PCR (FLU A&B, COVID) ARPGX2
Influenza A by PCR: POSITIVE — AB
Influenza B by PCR: NEGATIVE
SARS Coronavirus 2 by RT PCR: NEGATIVE

## 2021-09-22 MED ORDER — BENZONATATE 100 MG PO CAPS: 100.0000 mg | ORAL_CAPSULE | Freq: Three times a day (TID) | ORAL | 0 refills | Status: DC

## 2021-09-22 MED ORDER — OSELTAMIVIR PHOSPHATE 75 MG PO CAPS
75.0000 mg | ORAL_CAPSULE | Freq: Two times a day (BID) | ORAL | 0 refills | Status: DC
Start: 2021-09-22 — End: 2021-10-01

## 2021-09-22 NOTE — ED Triage Notes (Signed)
Patient is here for "a cold since Thursday". Runny nose at first, then Cough. Still some congestion.  ? ?Note: This morning, mom rang door bell, jumped up went to door, talked to her, then shut door, balance got off, just after fell down, hit head, after a few mins was ok. No loc. No nausea. No vomiting. No "dizziness" prior.  ?

## 2021-09-22 NOTE — Discharge Instructions (Addendum)
You tested positive for influenza A.  Start Tamiflu twice daily to help decrease how long you are sick and prevent complications.  Use Tessalon for cough.  Use over-the-counter medications including Mucinex, Flonase, Tylenol for symptom relief.  Make sure you rest and drink plenty of fluid.  If you do not have improvement in symptoms within 1 week please return for reevaluation.  If anything worsens please return for reevaluation including high fever, chest pain, shortness of breath, nausea/vomiting interfering with oral intake. ? ?I do not think you need to go to the emergency room for a CT scan at this time.  If you develop any headache, dizziness, nausea/vomiting, weakness you should go to the ER for further evaluation and management. ?

## 2021-09-22 NOTE — ED Provider Notes (Signed)
?[place] ? ? ? ?CSN: [id number] ?Arrival date & time: 09/22/21  7616 ? ? ?  ? ?History   ?Chief Complaint ?Chief Complaint  ?Patient presents with  ? Cough  ? Nasal Congestion  ? Fall  ? ? ?HPI ?Molly Kelly is a 66 y.o. female.  ? ?Patient presents today with a 3-day history of URI symptoms.  She reports runny nose, cough, congestion.  Reports initially she had significant fatigue and malaise but this has improved.  She has not had a fever.  She denies any chest pain, shortness of breath, nausea, vomiting, diarrhea.  Has been using ibuprofen and Alka-Seltzer plus without improvement of symptoms.  She is up-to-date on influenza and COVID-19 vaccines.  Has not had COVID in the past.  Does report sick contact at work with similar symptoms.  She denies history of allergies, asthma, smoking, COPD.  Denies any recent antibiotic use. ? ?In addition, patient reports that she had a fall earlier today.  She was awoken by her mother ringing the doorbell when she rushed to the door.  After standing for several minutes that she went to turn around which caused her to fall.  She denies any loss of consciousness.  She did hit her head on the back of the door but denies any LOC, headaches, dizziness, nausea, vomiting, amnesia surrounding event.  She does not take any blood thinning medication on regular basis.  Denies history of recurrent concussions.  Reports she is currently feeling well and denies any significant symptoms. ? ? ?Past Medical History:  ?Diagnosis Date  ? Acid reflux   ? Hypercholesteremia   ? Hypertension   ? ? ?Patient Active Problem List  ? Diagnosis Date Noted  ? Retained intrauterine contraceptive device (IUD) 08/12/2021  ? Primary hyperparathyroidism ([place]) 08/12/2021  ? Type II diabetes mellitus with complication ([place]) 07/37/1062  ? Mixed hyperlipidemia 08/11/2018  ? Special screening for malignant neoplasms, colon   ? Benign neoplasm of transverse colon   ? Tubular adenoma of colon   ?  Coronary artery disease involving native heart 05/02/2015  ? Gastroesophageal reflux disease 05/02/2015  ? Benign hypertension 05/02/2015  ? ? ?Past Surgical History:  ?Procedure Laterality Date  ? CARPAL TUNNEL RELEASE Bilateral   ? right - 14 yrs ago.  left - 06/2017  ? COLONOSCOPY WITH PROPOFOL N/A 04/29/2018  ? Procedure: COLONOSCOPY WITH BIOPSY;  Surgeon: [name], MD;  Location: [place];  Service: Endoscopy;  Laterality: N/A;  ? POLYPECTOMY N/A 04/29/2018  ? Procedure: POLYPECTOMY;  Surgeon: [name], MD;  Location: [place];  Service: Endoscopy;  Laterality: N/A;  ? ? ?OB History   ?No obstetric history on file. ?  ? ? ? ?Home Medications   ? ?Prior to Admission medications   ?Medication Sig Start Date End Date Taking? Authorizing Provider  ?Ascorbic Acid (VITA-C PO) Take 1 tablet by mouth daily.   Yes [provider]  ?aspirin 81 MG tablet Take 1 tablet by mouth daily.   Yes [provider]  ?benzonatate (TESSALON) 100 MG capsule Take 1 capsule (100 mg total) by mouth every 8 (eight) hours. 09/22/21  Yes Maximilien Hayashi K, PA-C  ?enalapril (VASOTEC) 10 MG tablet TAKE 1 TABLET BY MOUTH EVERY DAY 04/17/21  Yes Glean Hess, MD  ?metFORMIN (GLUCOPHAGE-XR) 500 MG 24 hr tablet TAKE 1 TABLET BY MOUTH EVERY DAY WITH BREAKFAST 09/17/21  Yes Glean Hess, MD  ?oseltamivir (TAMIFLU) 75 MG capsule Take 1  capsule (75 mg total) by mouth every 12 (twelve) hours. 09/22/21  Yes Nic Lampe K, PA-C  ?rosuvastatin (CRESTOR) 10 MG tablet Take 1 tablet (10 mg total) by mouth daily. 04/19/21  Yes Glean Hess, MD  ?spironolactone (ALDACTONE) 25 MG tablet TAKE 1 TABLET BY MOUTH EVERY DAY 07/02/21  Yes Glean Hess, MD  ?VITAMIN D PO Take 1 tablet by mouth daily.   Yes [provider]  ?zinc gluconate 50 MG tablet Take 50 mg by mouth daily.   Yes [provider]  ?Multiple Vitamins-Minerals (ZINC PO) Take 1 tablet by mouth daily.    [provider]  ? ? ?Family History ?Family History  ?Problem Relation Age of Onset  ? Hypertension Mother   ? Uterine cancer Mother   ? CAD Brother   ? Breast cancer Cousin   ?     mat cousin  ? ? ?Social History ?Social History  ? ?Tobacco Use  ? Smoking status: Never  ? Smokeless tobacco: Never  ?Vaping Use  ? Vaping Use: Never used  ?Substance Use Topics  ? Alcohol use: Yes  ?  Comment: socially  ? Drug use: No  ? ? ? ?Allergies   ?Patient has no known allergies. ? ? ?Review of Systems ?Review of Systems  ?Constitutional:  Positive for activity change. Negative for appetite change, fatigue and fever.  ?HENT:  Positive for congestion and sore throat. Negative for sinus pressure and sneezing.   ?Eyes:  Negative for visual disturbance.  ?Respiratory:  Positive for cough. Negative for shortness of breath.   ?Cardiovascular:  Negative for chest pain.  ?Gastrointestinal:  Negative for abdominal pain, diarrhea, nausea and vomiting.  ?Neurological:  Negative for dizziness, seizures, syncope, speech difficulty, weakness, light-headedness and headaches.  ? ? ?Physical Exam ?Triage Vital Signs ?ED Triage Vitals  ?Enc Vitals Group  ?   BP 09/22/21 0913 130/89  ?   Pulse Rate 09/22/21 0913 92  ?   Resp 09/22/21 0913 18  ?   Temp 09/22/21 0913 98.2 ?F (36.8 ?C)  ?   Temp Source 09/22/21 0913 Oral  ?   SpO2 09/22/21 0913 98 %  ?   Weight 09/22/21 0910 209 lb (94.8 kg)  ?   Height 09/22/21 0910 5' 4.5" (1.638 m)  ?   Head Circumference --   ?   Peak Flow --   ?   Pain Score 09/22/21 0910 0  ?   Pain Loc --   ?   Pain Edu? --   ?   Excl. in [place]? --   ? ?No data found. ? ?Updated Vital Signs ?BP 130/89 (BP Location: Left Arm)   Pulse 92   Temp 98.2 ?F (36.8 ?C) (Oral)   Resp 18   Ht 5' 4.5" (1.638 m)   Wt 209 lb (94.8 kg)   SpO2 98%   BMI 35.32 kg/m?  ? ?Visual Acuity ?Right Eye Distance:   ?Left Eye Distance:   ?Bilateral Distance:   ? ?Right Eye Near:   ?Left Eye Near:    ?Bilateral Near:    ? ?Physical Exam ?Vitals  reviewed.  ?Constitutional:   ?   General: She is awake. She is not in acute distress. ?   Appearance: Normal appearance. She is well-developed. She is not ill-appearing.  ?   Comments: Very pleasant female appears stated age in no acute distress sitting comfortably in exam room  ?HENT:  ?   Head: Normocephalic and atraumatic.  No raccoon eyes, Battle's sign or contusion.  ?   Right Ear: Tympanic membrane, ear canal and external ear normal. No hemotympanum.  ?   Left Ear: Tympanic membrane, ear canal and external ear normal. No hemotympanum.  ?   Nose: Nose normal.  ?   Mouth/Throat:  ?   Tongue: Tongue does not deviate from midline.  ?   Pharynx: Uvula midline. No oropharyngeal exudate or posterior oropharyngeal erythema.  ?Eyes:  ?   Extraocular Movements: Extraocular movements intact.  ?   Conjunctiva/sclera: Conjunctivae normal.  ?   Pupils: Pupils are equal, round, and reactive to light.  ?Cardiovascular:  ?   Rate and Rhythm: Normal rate and regular rhythm.  ?   Heart sounds: Normal heart sounds, S1 normal and S2 normal. No murmur heard. ?Pulmonary:  ?   Effort: Pulmonary effort is normal.  ?   Breath sounds: Normal breath sounds. No wheezing, rhonchi or rales.  ?   Comments: Clear to auscultation bilaterally ?Musculoskeletal:  ?   Cervical back: Normal range of motion and neck supple. No spinous process tenderness or muscular tenderness.  ?   Comments: Strength 5/5 bilateral upper and lower extremities  ?Lymphadenopathy:  ?   Head:  ?   Right side of head: No submental, submandibular or tonsillar adenopathy.  ?   Left side of head: No submental, submandibular or tonsillar adenopathy.  ?Neurological:  ?   General: No focal deficit present.  ?   Mental Status: She is alert and oriented to person, place, and time.  ?   Cranial Nerves: Cranial nerves 2-12 are intact.  ?   Motor: Motor function is intact.  ?   Coordination: Coordination is intact. Romberg sign negative.  ?   Gait: Gait is intact.  ?Psychiatric:      ?   Behavior: Behavior is cooperative.  ? ? ? ?UC Treatments / Results  ?Labs ?(all labs ordered are listed, but only abnormal results are displayed) ?Labs Reviewed  ?RESP PANEL BY RT-PCR (FLU A&B, COVID) ARPGX2 - Abnormal

## 2021-09-24 ENCOUNTER — Telehealth: Payer: Self-pay | Admitting: Internal Medicine

## 2021-09-24 NOTE — Telephone Encounter (Signed)
Copied from [place] [phone]. Topic: General - Other ?>> Sep 24, 2021  4:00 PM Tessa Lerner A wrote: ?Reason for CRM: The patient requested to speak with a member of clinical staff when possible  ? ?The patient declined to elaborate on the reason for the call  ? ?Please contact further ?

## 2021-09-25 NOTE — Telephone Encounter (Signed)
Patient called, no answer on cell phone, mailbox is full. ?

## 2021-09-25 NOTE — Telephone Encounter (Signed)
Called pt left VM to call back. ? ?[facility] nurse may give results to patient if they return call to clinic, a CRM has been created. ? ?KP

## 2021-10-01 ENCOUNTER — Ambulatory Visit: Payer: Managed Care, Other (non HMO) | Admitting: Internal Medicine

## 2021-10-01 ENCOUNTER — Other Ambulatory Visit: Payer: Self-pay | Admitting: Internal Medicine

## 2021-10-01 ENCOUNTER — Encounter: Payer: Self-pay | Admitting: Internal Medicine

## 2021-10-01 VITALS — BP 138/88 | HR 89 | Temp 97.8°F | Ht 64.5 in | Wt 213.0 lb

## 2021-10-01 DIAGNOSIS — H6690 Otitis media, unspecified, unspecified ear: Secondary | ICD-10-CM | POA: Diagnosis not present

## 2021-10-01 DIAGNOSIS — I10 Essential (primary) hypertension: Secondary | ICD-10-CM

## 2021-10-01 DIAGNOSIS — R051 Acute cough: Secondary | ICD-10-CM

## 2021-10-01 MED ORDER — AMOXICILLIN-POT CLAVULANATE 875-125 MG PO TABS
1.0000 | ORAL_TABLET | Freq: Two times a day (BID) | ORAL | 0 refills | Status: AC
Start: 2021-10-01 — End: 2021-10-11

## 2021-10-01 MED ORDER — PROMETHAZINE-DM 6.25-15 MG/5ML PO SYRP: 5.0000 mL | ORAL_SOLUTION | Freq: Four times a day (QID) | ORAL | 0 refills | Status: DC | PRN

## 2021-10-01 NOTE — Patient Instructions (Signed)
Coricidin HBP - for ear congestion ? ?Mucinex DM - for cough and chest congestion ?

## 2021-10-01 NOTE — Progress Notes (Signed)
? ? ?Date:  10/01/2021  ? ?Name:  Molly Kelly   DOB:  Nov 26, 1955   MRN:  [id number] ? ? ?Chief Complaint: Cough and Ear Pain ? ?Cough ?This is a recurrent problem. The current episode started 1 to 4 weeks ago. The cough is Productive of sputum. Associated symptoms include ear pain, postnasal drip and wheezing. Pertinent negatives include no chest pain, chills, fever, headaches, nasal congestion, rhinorrhea (Clear), sore throat or shortness of breath. The symptoms are aggravated by lying down.  ?Otalgia  ?There is pain in both ears. This is a new problem. Episode onset: Thursday 10/27/21 Patient flew on plane and her ears popped and they have beem hurting since then. The problem occurs constantly. Associated symptoms include coughing. Pertinent negatives include no headaches, rhinorrhea (Clear), sore throat or vomiting.  ? ?Lab Results  ?Component Value Date  ? NA 141 08/12/2021  ? K 4.8 08/12/2021  ? CO2 22 08/12/2021  ? GLUCOSE 102 (H) 08/12/2021  ? BUN 9 08/12/2021  ? CREATININE 0.65 08/12/2021  ? CALCIUM 10.8 (H) 08/12/2021  ? EGFR 99 08/12/2021  ? GFRNONAA 94 04/16/2020  ? ?Lab Results  ?Component Value Date  ? CHOL 181 04/19/2021  ? HDL 38 (L) 04/19/2021  ? LDLCALC 123 (H) 04/19/2021  ? TRIG 109 04/19/2021  ? CHOLHDL 4.8 (H) 04/19/2021  ? ?Lab Results  ?Component Value Date  ? TSH 1.610 04/19/2021  ? ?Lab Results  ?Component Value Date  ? HGBA1C 6.1 (H) 08/12/2021  ? ?Lab Results  ?Component Value Date  ? WBC 4.6 04/19/2021  ? HGB 13.8 04/19/2021  ? HCT 41.3 04/19/2021  ? MCV 92 04/19/2021  ? PLT 290 04/19/2021  ? ?Lab Results  ?Component Value Date  ? ALT 15 08/12/2021  ? AST 17 08/12/2021  ? ALKPHOS 60 08/12/2021  ? BILITOT 0.4 08/12/2021  ? ?No results found for: 25OHVITD2, [place], VD25OH  ? ?Review of Systems  ?Constitutional:  Negative for chills, fatigue and fever.  ?HENT:  Positive for ear pain and postnasal drip. Negative for rhinorrhea (Clear) and sore throat.   ?Eyes:  Negative for visual  disturbance.  ?Respiratory:  Positive for cough and wheezing. Negative for chest tightness and shortness of breath.   ?Cardiovascular:  Negative for chest pain and palpitations.  ?Gastrointestinal:  Negative for nausea and vomiting.  ?Neurological:  Negative for dizziness and headaches.  ?Psychiatric/Behavioral:  Positive for sleep disturbance. Negative for dysphoric mood. The patient is not nervous/anxious.   ? ?Patient Active Problem List  ? Diagnosis Date Noted  ? Retained intrauterine contraceptive device (IUD) 08/12/2021  ? Primary hyperparathyroidism ([place]) 08/12/2021  ? Type II diabetes mellitus with complication ([place]) 15/72/6203  ? Mixed hyperlipidemia 08/11/2018  ? Special screening for malignant neoplasms, colon   ? Benign neoplasm of transverse colon   ? Tubular adenoma of colon   ? Coronary artery disease involving native heart 05/02/2015  ? Gastroesophageal reflux disease 05/02/2015  ? Benign hypertension 05/02/2015  ? ? ?No Known Allergies ? ?Past Surgical History:  ?Procedure Laterality Date  ? CARPAL TUNNEL RELEASE Bilateral   ? right - 14 yrs ago.  left - 06/2017  ? COLONOSCOPY WITH PROPOFOL N/A 04/29/2018  ? Procedure: COLONOSCOPY WITH BIOPSY;  Surgeon: [name], MD;  Location: [place];  Service: Endoscopy;  Laterality: N/A;  ? POLYPECTOMY N/A 04/29/2018  ? Procedure: POLYPECTOMY;  Surgeon: [name], MD;  Location: [facility];  Service: Endoscopy;  Laterality: N/A;  ? ? ?Social  History  ? ?Tobacco Use  ? Smoking status: Never  ? Smokeless tobacco: Never  ?Vaping Use  ? Vaping Use: Never used  ?Substance Use Topics  ? Alcohol use: Yes  ?  Comment: socially  ? Drug use: No  ? ? ? ?Medication list has been reviewed and updated. ? ?Current Meds  ?Medication Sig  ? Ascorbic Acid (VITA-C PO) Take 1 tablet by mouth daily.  ? aspirin 81 MG tablet Take 1 tablet by mouth daily.  ? enalapril (VASOTEC) 10 MG tablet TAKE 1 TABLET BY MOUTH EVERY DAY  ? metFORMIN (GLUCOPHAGE-XR) 500 MG  24 hr tablet TAKE 1 TABLET BY MOUTH EVERY DAY WITH BREAKFAST  ? Multiple Vitamins-Minerals (ZINC PO) Take 1 tablet by mouth daily.  ? rosuvastatin (CRESTOR) 10 MG tablet Take 1 tablet (10 mg total) by mouth daily.  ? spironolactone (ALDACTONE) 25 MG tablet TAKE 1 TABLET BY MOUTH EVERY DAY  ? VITAMIN D PO Take 1 tablet by mouth daily.  ? zinc gluconate 50 MG tablet Take 50 mg by mouth daily.  ? ? ? ?  10/01/2021  ? 11:46 AM 08/12/2021  ? 10:56 AM 04/19/2021  ?  9:54 AM 03/13/2021  ?  9:10 AM  ?GAD 7 : Generalized Anxiety Score  ?Nervous, Anxious, on Edge 0 0 0 0  ?Control/stop worrying 0 0 0 0  ?Worry too much - different things 0 0 0 0  ?Trouble relaxing 0 0 0 0  ?Restless 0 0 0 0  ?Easily annoyed or irritable 0 0 0 0  ?Afraid - awful might happen 0 0 0 0  ?Total GAD 7 Score 0 0 0 0  ?Anxiety Difficulty   Not difficult at all   ? ? ? ?  10/01/2021  ? 11:46 AM  ?Depression screen PHQ 2/9  ?Decreased Interest 0  ?Down, Depressed, Hopeless 0  ?PHQ - 2 Score 0  ?Altered sleeping 3  ?Tired, decreased energy 0  ?Change in appetite 0  ?Feeling bad or failure about yourself  0  ?Trouble concentrating 0  ?Moving slowly or fidgety/restless 0  ?Suicidal thoughts 0  ?PHQ-9 Score 3  ?Difficult doing work/chores Not difficult at all  ? ? ?BP Readings from Last 3 Encounters:  ?10/01/21 (!) 142/98  ?09/22/21 130/89  ?08/12/21 118/76  ? ? ?Physical Exam ?Constitutional:   ?   Appearance: Normal appearance.  ?HENT:  ?   Right Ear: Hearing normal. Tympanic membrane is retracted. Tympanic membrane is not erythematous.  ?   Left Ear: Hearing normal. Tympanic membrane is retracted. Tympanic membrane is not erythematous.  ?   Nose:  ?   Right Sinus: No maxillary sinus tenderness or frontal sinus tenderness.  ?   Left Sinus: No maxillary sinus tenderness or frontal sinus tenderness.  ?Cardiovascular:  ?   Rate and Rhythm: Normal rate and regular rhythm.  ?   Pulses: Normal pulses.  ?Pulmonary:  ?   Effort: Pulmonary effort is normal.  ?   Breath  sounds: Normal breath sounds. Transmitted upper airway sounds present. No wheezing or rhonchi.  ?Musculoskeletal:  ?   Cervical back: Normal range of motion.  ?Lymphadenopathy:  ?   Cervical: No cervical adenopathy.  ?Neurological:  ?   Mental Status: She is alert.  ? ? ?Wt Readings from Last 3 Encounters:  ?10/01/21 213 lb (96.6 kg)  ?09/22/21 209 lb (94.8 kg)  ?08/12/21 211 lb (95.7 kg)  ? ? ?BP (!) 142/98   Pulse 89  Temp 97.8 ?F (36.6 ?C) (Oral)   Ht 5' 4.5" (1.638 m)   Wt 213 lb (96.6 kg)   SpO2 98%   BMI 36.00 kg/m?  ? ?Assessment and Plan: ?1. Acute otitis media, unspecified otitis media type ?Recommend Coricidin HBP for ear discomfort/congestion ?- amoxicillin-clavulanate (AUGMENTIN) 875-125 MG tablet; Take 1 tablet by mouth 2 (two) times daily for 10 days.  Dispense: 20 tablet; Refill: 0 ? ?2. Acute cough ?- promethazine-dextromethorphan (PROMETHAZINE-DM) 6.25-15 MG/5ML syrup; Take 5 mLs by mouth 4 (four) times daily as needed for cough.  Dispense: 118 mL; Refill: 0 ? ? ?Partially dictated using Editor, commissioning. Any errors are unintentional. ? ?Halina Maidens, MD ?[facility] ?[facility] ? ?10/01/2021 ? ? ? ? ?

## 2021-10-02 NOTE — Telephone Encounter (Signed)
Requested Prescriptions  ?Pending Prescriptions Disp Refills  ?? spironolactone (ALDACTONE) 25 MG tablet [Pharmacy Med Name: SPIRONOLACTONE 25 MG TABLET] 90 tablet 0  ?  Sig: TAKE 1 TABLET BY MOUTH EVERY DAY  ?  ? Cardiovascular: Diuretics - Aldosterone Antagonist Passed - 10/01/2021  2:22 AM  ?  ?  Passed - Cr in normal range and within 180 days  ?  Creatinine, Ser  ?Date Value Ref Range Status  ?08/12/2021 0.65 0.57 - 1.00 mg/dL Final  ?   ?  ?  Passed - K in normal range and within 180 days  ?  Potassium  ?Date Value Ref Range Status  ?08/12/2021 4.8 3.5 - 5.2 mmol/L Final  ?   ?  ?  Passed - Na in normal range and within 180 days  ?  Sodium  ?Date Value Ref Range Status  ?08/12/2021 141 134 - 144 mmol/L Final  ?   ?  ?  Passed - eGFR is 30 or above and within 180 days  ?  GFR calc Af Amer  ?Date Value Ref Range Status  ?04/16/2020 108 >59 mL/min/1.73 Final  ?  Comment:  ?  **In accordance with recommendations from the NKF-ASN Task force,** ?  [facility] is in the process of updating its eGFR calculation to the ?  2021 CKD-EPI creatinine equation that estimates kidney function ?  without a race variable. ?  ? ?GFR calc non Af Amer  ?Date Value Ref Range Status  ?04/16/2020 94 >59 mL/min/1.73 Final  ? ?eGFR  ?Date Value Ref Range Status  ?08/12/2021 99 >59 mL/min/1.73 Final  ?   ?  ?  Passed - Last BP in normal range  ?  BP Readings from Last 1 Encounters:  ?10/01/21 138/88  ?   ?  ?  Passed - Valid encounter within last 6 months  ?  Recent Outpatient Visits   ?      ? Yesterday Acute otitis media, unspecified otitis media type  ? [facility]an [facility], [facility]  ? 1 month ago Type II diabetes mellitus with complication [facility])  ? [facility], MD  ? 5 months ago Annual physical exam  ? [facility], [facility]  ? 6 months ago Type II diabetes mellitus with complication [facility])  ? [facility]  ? 1 year ago Type II diabetes mellitus  with complication [facility])  ? [facility] Gle[facility], MD  ?  ?  ?Future Appointments   ?        ? In 2 months Army Melia Jesse Sans, MD [facility], [place]  ? In 6 months Army Melia Jesse Sans, MD [facility] [place]  ?  ? ?  ?  ?  ? ? ?

## 2021-10-16 ENCOUNTER — Other Ambulatory Visit: Payer: Self-pay | Admitting: Internal Medicine

## 2021-10-16 DIAGNOSIS — I10 Essential (primary) hypertension: Secondary | ICD-10-CM

## 2021-10-16 NOTE — Telephone Encounter (Signed)
Requested Prescriptions  ?Pending Prescriptions Disp Refills  ?? enalapril (VASOTEC) 10 MG tablet [Pharmacy Med Name: ENALAPRIL MALEATE 10 MG TAB] 90 tablet 0  ?  Sig: TAKE 1 TABLET BY MOUTH EVERY DAY  ?  ? Cardiovascular:  ACE Inhibitors Passed - 10/16/2021  2:30 AM  ?  ?  Passed - Cr in normal range and within 180 days  ?  Creatinine, Ser  ?Date Value Ref Range Status  ?08/12/2021 0.65 0.57 - 1.00 mg/dL Final  ?   ?  ?  Passed - K in normal range and within 180 days  ?  Potassium  ?Date Value Ref Range Status  ?08/12/2021 4.8 3.5 - 5.2 mmol/L Final  ?   ?  ?  Passed - Patient is not pregnant  ?  ?  Passed - Last BP in normal range  ?  BP Readings from Last 1 Encounters:  ?10/01/21 138/88  ?   ?  ?  Passed - Valid encounter within last 6 months  ?  Recent Outpatient Visits   ?      ? 2 weeks ago Acute otitis media, unspecified otitis media type  ? [facility]  ? 2 months ago Type II diabetes mellitus with complication [facility])  ? [facility], [facility]  ? 6 months ago Annual physical exam  ? [facility], [facility]  ? 7 months ago Type II diabetes mellitus with complication [facility])  ? [facility], [facility]  ? 1 year ago Type II diabetes mellitus with complication [facility])  ? [facility] Glean Hess, MD  ?  ?  ?Future Appointments   ?        ? In 1 month Army Melia Jesse Sans, MD [facility], [place]  ? In 6 months Army Melia Jesse Sans, MD [facility], [place]  ?  ? ?  ?  ?  ? ? ?

## 2021-12-11 ENCOUNTER — Ambulatory Visit: Payer: Managed Care, Other (non HMO) | Admitting: Internal Medicine

## 2021-12-29 ENCOUNTER — Other Ambulatory Visit: Payer: Self-pay | Admitting: Internal Medicine

## 2021-12-29 DIAGNOSIS — I10 Essential (primary) hypertension: Secondary | ICD-10-CM

## 2021-12-30 NOTE — Telephone Encounter (Signed)
Requested Prescriptions  Pending Prescriptions Disp Refills  . spironolactone (ALDACTONE) 25 MG tablet [Pharmacy Med Name: SPIRONOLACTONE 25 MG TABLET] 90 tablet 0    Sig: TAKE 1 TABLET BY MOUTH EVERY DAY     Cardiovascular: Diuretics - Aldosterone Antagonist Passed - 12/29/2021  9:31 AM      Passed - Cr in normal range and within 180 days    Creatinine, Ser  Date Value Ref Range Status  08/12/2021 0.65 0.57 - 1.00 mg/dL Final         Passed - K in normal range and within 180 days    Potassium  Date Value Ref Range Status  08/12/2021 4.8 3.5 - 5.2 mmol/L Final         Passed - Na in normal range and within 180 days    Sodium  Date Value Ref Range Status  08/12/2021 141 134 - 144 mmol/L Final         Passed - eGFR is 30 or above and within 180 days    GFR calc Af Amer  Date Value Ref Range Status  04/16/2020 108 >59 mL/min/1.73 Final    Comment:    **In accordance with recommendations from the NKF-ASN Task force,**   [facility] is in the process of updating its eGFR calculation to the   2021 CKD-EPI creatinine equation that estimates kidney function   without a race variable.    GFR calc non Af Amer  Date Value Ref Range Status  04/16/2020 94 >59 mL/min/1.73 Final   eGFR  Date Value Ref Range Status  08/12/2021 99 >59 mL/min/1.73 Final         Passed - Last BP in normal range    BP Readings from Last 1 Encounters:  10/01/21 138/88         Passed - Valid encounter within last 6 months    Recent Outpatient Visits          3 months ago Acute otitis media, unspecified otitis media type   [facility], [facility]   4 months ago Type II diabetes mellitus with complication [facility])   [facility], [facility]   8 months ago Annual physical exam   [facility]   9 months ago Type II diabetes mellitus with complication [facility])   [facility], MD   1 year ago Type II diabetes  mellitus with complication [facility])   [facility]an [facility], MD      Future Appointments            In 3 months Army Melia Jesse Sans, MD [facility]

## 2022-01-12 ENCOUNTER — Other Ambulatory Visit: Payer: Self-pay | Admitting: Internal Medicine

## 2022-01-12 DIAGNOSIS — I10 Essential (primary) hypertension: Secondary | ICD-10-CM

## 2022-01-14 ENCOUNTER — Other Ambulatory Visit: Payer: Self-pay

## 2022-01-14 NOTE — Telephone Encounter (Signed)
Requested medication (s) are due for refill today:   Yes  Requested medication (s) are on the active medication list:   Yes  Future visit scheduled:   Yes   Last ordered: 10/16/2021 #90, 0 refills  Returned because got a very high warning from pharmacy regarding the aldactone and this medication causing hyperkalemia.     Requested Prescriptions  Pending Prescriptions Disp Refills   enalapril (VASOTEC) 10 MG tablet [Pharmacy Med Name: ENALAPRIL MALEATE 10 MG TAB] 90 tablet 0    Sig: TAKE 1 TABLET BY MOUTH EVERY DAY     Cardiovascular:  ACE Inhibitors Passed - 01/12/2022 10:21 AM      Passed - Cr in normal range and within 180 days    Creatinine, Ser  Date Value Ref Range Status  08/12/2021 0.65 0.57 - 1.00 mg/dL Final         Passed - K in normal range and within 180 days    Potassium  Date Value Ref Range Status  08/12/2021 4.8 3.5 - 5.2 mmol/L Final         Passed - Patient is not pregnant      Passed - Last BP in normal range    BP Readings from Last 1 Encounters:  10/01/21 138/88         Passed - Valid encounter within last 6 months    Recent Outpatient Visits           3 months ago Acute otitis media, unspecified otitis media type   [facility]an [facility], [facility]   5 months ago Type II diabetes mellitus with complication [facility])   [facility]   9 months ago Annual physical exam   [facility], [facility]   10 months ago Type II diabetes mellitus with complication [facility])   [facility], [facility]   1 year ago Type II diabetes mellitus with complication [facility])   [facility], [facility]       Future Appointments             In 3 months Army Melia Jesse Sans, MD [facility]

## 2022-02-19 LAB — HM DIABETES EYE EXAM

## 2022-03-22 ENCOUNTER — Other Ambulatory Visit: Payer: Self-pay | Admitting: Internal Medicine

## 2022-03-22 DIAGNOSIS — E118 Type 2 diabetes mellitus with unspecified complications: Secondary | ICD-10-CM

## 2022-03-24 NOTE — Telephone Encounter (Signed)
Requested Prescriptions  Pending Prescriptions Disp Refills  . metFORMIN (GLUCOPHAGE-XR) 500 MG 24 hr tablet [Pharmacy Med Name: METFORMIN HCL ER 500 MG TABLET] 90 tablet 0    Sig: TAKE 1 TABLET BY MOUTH EVERY DAY WITH BREAKFAST     Endocrinology:  Diabetes - Biguanides Failed - 03/22/2022  9:30 AM      Failed - HBA1C is between 0 and 7.9 and within 180 days    Hgb A1c MFr Bld  Date Value Ref Range Status  08/12/2021 6.1 (H) 4.8 - 5.6 % Final    Comment:             Prediabetes: 5.7 - 6.4          Diabetes: >6.4          Glycemic control for adults with diabetes: <7.0          Failed - B12 Level in normal range and within 720 days    No results found for: "VITAMINB12"       Passed - Cr in normal range and within 360 days    Creatinine, Ser  Date Value Ref Range Status  08/12/2021 0.65 0.57 - 1.00 mg/dL Final         Passed - eGFR in normal range and within 360 days    GFR calc Af Amer  Date Value Ref Range Status  04/16/2020 108 >59 mL/min/1.73 Final    Comment:    **In accordance with recommendations from the NKF-ASN Task force,**   [facility] is in the process of updating its eGFR calculation to the   2021 CKD-EPI creatinine equation that estimates kidney function   without a race variable.    GFR calc non Af Amer  Date Value Ref Range Status  04/16/2020 94 >59 mL/min/1.73 Final   eGFR  Date Value Ref Range Status  08/12/2021 99 >59 mL/min/1.73 Final         Passed - Valid encounter within last 6 months    Recent Outpatient Visits          5 months ago Acute otitis media, unspecified otitis media type   [place] [facility], Jesse Sans, MD   7 months ago Type II diabetes mellitus with complication [facility])   [place] [facility] at [facility], Jesse Sans, MD   11 months ago Annual physical exam   [place] [facility] at [facility], Jesse Sans, MD    1 year ago Type II diabetes mellitus with complication [facility])   [place] [facility] at [facility], Jesse Sans, MD   1 year ago Type II diabetes mellitus with complication [facility])   [place]  [facility] at [facility], Jesse Sans, MD      Future Appointments            In 1 month Army Melia Jesse Sans, MD [facility] and [facility] at [facility], [place] within normal limits and completed in the last 12 months    WBC  Date Value Ref Range Status  04/19/2021 4.6 3.4 - 10.8 x10E3/uL Final   RBC  Date Value Ref Range Status  04/19/2021 4.47 3.77 - 5.28 x10E6/uL Final   Hemoglobin  Date Value Ref Range Status  04/19/2021 13.8 11.1 - 15.9 g/dL Final  Hematocrit  Date Value Ref Range Status  04/19/2021 41.3 34.0 - 46.6 % Final   MCHC  Date Value Ref Range Status  04/19/2021 33.4 31.5 - 35.7 g/dL Final   [facility]  Date Value Ref Range Status  04/19/2021 30.9 26.6 - 33.0 pg Final   MCV  Date Value Ref Range Status  04/19/2021 92 79 - 97 fL Final   No results found for: "PLTCOUNTKUC", "LABPLAT", "POCPLA" RDW  Date Value Ref Range Status  04/19/2021 12.3 11.7 - 15.4 % Final

## 2022-03-29 ENCOUNTER — Other Ambulatory Visit: Payer: Self-pay | Admitting: Internal Medicine

## 2022-03-29 DIAGNOSIS — I10 Essential (primary) hypertension: Secondary | ICD-10-CM

## 2022-03-31 NOTE — Telephone Encounter (Signed)
Requested Prescriptions  Pending Prescriptions Disp Refills  . spironolactone (ALDACTONE) 25 MG tablet [Pharmacy Med Name: SPIRONOLACTONE 25 MG TABLET] 90 tablet 0    Sig: TAKE 1 TABLET BY MOUTH EVERY DAY     Cardiovascular: Diuretics - Aldosterone Antagonist Failed - 03/29/2022  9:32 AM      Failed - Cr in normal range and within 180 days    Creatinine, Ser  Date Value Ref Range Status  08/12/2021 0.65 0.57 - 1.00 mg/dL Final         Failed - K in normal range and within 180 days    Potassium  Date Value Ref Range Status  08/12/2021 4.8 3.5 - 5.2 mmol/L Final         Failed - Na in normal range and within 180 days    Sodium  Date Value Ref Range Status  08/12/2021 141 134 - 144 mmol/L Final         Failed - eGFR is 30 or above and within 180 days    GFR calc Af Amer  Date Value Ref Range Status  04/16/2020 108 >59 mL/min/1.73 Final    Comment:    **In accordance with recommendations from the NKF-ASN Task force,**   [facility] is in the process of updating its eGFR calculation to the   2021 CKD-EPI creatinine equation that estimates kidney function   without a race variable.    GFR calc non Af Amer  Date Value Ref Range Status  04/16/2020 94 >59 mL/min/1.73 Final   eGFR  Date Value Ref Range Status  08/12/2021 99 >59 mL/min/1.73 Final         Passed - Last BP in normal range    BP Readings from Last 1 Encounters:  10/01/21 138/88         Passed - Valid encounter within last 6 months    Recent Outpatient Visits          6 months ago Acute otitis media, unspecified otitis media type   [place] [facility] at [facility], Jesse Sans, MD   7 months ago Type II diabetes mellitus with complication [facility])   [place] [facility] at [facility], Jesse Sans, MD   11 months ago Annual physical exam   [place] [facility] at [facility], Jesse Sans, MD   1 year ago Type  II diabetes mellitus with complication [facility])   [place] [facility] at [facility], Jesse Sans, MD   1 year ago Type II diabetes mellitus with complication [facility])   [place] [facility] and [facility] at [facility], Jesse Sans, MD      Future Appointments            In 3 weeks Army Melia Jesse Sans, MD [place] and Sports Medicine at [facility]

## 2022-04-16 ENCOUNTER — Other Ambulatory Visit: Payer: Self-pay | Admitting: Internal Medicine

## 2022-04-16 DIAGNOSIS — I10 Essential (primary) hypertension: Secondary | ICD-10-CM

## 2022-04-16 NOTE — Telephone Encounter (Signed)
Requested medication (s) are due for refill today:   Yes  Requested medication (s) are on the active medication list:   Yes  Future visit scheduled:   Yes in 1 wk.   Last ordered: 01/14/2022 #90, 0 refills  Returned because labs are due.   Requested Prescriptions  Pending Prescriptions Disp Refills   enalapril (VASOTEC) 10 MG tablet [Pharmacy Med Name: ENALAPRIL MALEATE 10 MG TAB] 90 tablet 0    Sig: TAKE 1 TABLET BY MOUTH EVERY DAY     Cardiovascular:  ACE Inhibitors Failed - 04/16/2022  1:45 AM      Failed - Cr in normal range and within 180 days    Creatinine, Ser  Date Value Ref Range Status  08/12/2021 0.65 0.57 - 1.00 mg/dL Final         Failed - K in normal range and within 180 days    Potassium  Date Value Ref Range Status  08/12/2021 4.8 3.5 - 5.2 mmol/L Final         Failed - Valid encounter within last 6 months    Recent Outpatient Visits           6 months ago Acute otitis media, unspecified otitis media type   [place] [facility] at [facility], Jesse Sans, MD   8 months ago Type II diabetes mellitus with complication [facility])   [place] [facility] at [facility], Jesse Sans, MD   12 months ago Annual physical exam   [place] [facility] at [facility], Jesse Sans, MD   1 year ago Type II diabetes mellitus with complication [facility])   [place] [facility] at [facility], Jesse Sans, MD   1 year ago Type II diabetes mellitus with complication [facility])   [place] [facility] at [facility], Jesse Sans, MD       Future Appointments             In 1 week Army Melia Jesse Sans, MD [facility] at [facility] [place] - Patient is not pregnant      Passed - Last BP in normal range    BP Readings from Last 1 Encounters:  10/01/21 138/88

## 2022-04-24 ENCOUNTER — Encounter: Payer: Self-pay | Admitting: Internal Medicine

## 2022-04-24 ENCOUNTER — Ambulatory Visit (INDEPENDENT_AMBULATORY_CARE_PROVIDER_SITE_OTHER): Payer: Managed Care, Other (non HMO) | Admitting: Internal Medicine

## 2022-04-24 VITALS — BP 118/72 | HR 79 | Ht 64.5 in | Wt 211.0 lb

## 2022-04-24 DIAGNOSIS — I10 Essential (primary) hypertension: Secondary | ICD-10-CM

## 2022-04-24 DIAGNOSIS — Z1231 Encounter for screening mammogram for malignant neoplasm of breast: Secondary | ICD-10-CM

## 2022-04-24 DIAGNOSIS — Z0000 Encounter for general adult medical examination without abnormal findings: Secondary | ICD-10-CM | POA: Diagnosis not present

## 2022-04-24 DIAGNOSIS — K219 Gastro-esophageal reflux disease without esophagitis: Secondary | ICD-10-CM

## 2022-04-24 DIAGNOSIS — E118 Type 2 diabetes mellitus with unspecified complications: Secondary | ICD-10-CM

## 2022-04-24 DIAGNOSIS — E210 Primary hyperparathyroidism: Secondary | ICD-10-CM

## 2022-04-24 DIAGNOSIS — Z23 Encounter for immunization: Secondary | ICD-10-CM

## 2022-04-24 DIAGNOSIS — E1169 Type 2 diabetes mellitus with other specified complication: Secondary | ICD-10-CM | POA: Diagnosis not present

## 2022-04-24 DIAGNOSIS — E785 Hyperlipidemia, unspecified: Secondary | ICD-10-CM

## 2022-04-24 MED ORDER — SPIRONOLACTONE 25 MG PO TABS: 25.0000 mg | ORAL_TABLET | Freq: Every day | ORAL | 3 refills | Status: DC

## 2022-04-24 MED ORDER — ROSUVASTATIN CALCIUM 10 MG PO TABS: 10.0000 mg | ORAL_TABLET | Freq: Every day | ORAL | 3 refills | Status: DC

## 2022-04-24 MED ORDER — METFORMIN HCL ER 500 MG PO TB24: 500.0000 mg | ORAL_TABLET | Freq: Every day | ORAL | 3 refills | Status: DC

## 2022-04-24 MED ORDER — ENALAPRIL MALEATE 10 MG PO TABS: 10.0000 mg | ORAL_TABLET | Freq: Every day | ORAL | 3 refills | Status: DC

## 2022-04-24 NOTE — Progress Notes (Signed)
Date:  04/24/2022   Name:  Molly Kelly   DOB:  09-15-1955   MRN:  [id number]   Chief Complaint: Annual Exam Molly Kelly is a 66 y.o. female who presents today for her Complete Annual Exam. She feels well. She reports exercising 4 days a week . She reports she is sleeping well. Breast complaints - none.  Mammogram: 02/2021 DEXA: none Pap smear: 03/2021 neg/neg Colonoscopy: 03/2018 repeat 5 yrs  Health Maintenance Due  Topic Date Due   TETANUS/TDAP  09/17/2021   OPHTHALMOLOGY EXAM  11/27/2021   INFLUENZA VACCINE  01/28/2022   HEMOGLOBIN A1C  02/09/2022   MAMMOGRAM  03/13/2022    Immunization History  Administered Date(s) Administered   Influenza Inj Mdck Quad Pf 04/03/2017   Influenza,inj,Quad PF,6+ Mos 04/13/2018, 04/16/2020, 03/13/2021   Influenza-Unspecified 06/29/2016, 04/28/2019   PFIZER(Purple Top)SARS-COV-2 Vaccination 08/23/2019, 09/13/2019, 05/04/2020   Pneumococcal Polysaccharide-23 09/18/2020   Tdap 09/18/2011   Zoster Recombinat (Shingrix) 03/13/2021, 08/12/2021    Hypertension This is a chronic problem. The problem is controlled. Pertinent negatives include no chest pain, headaches, palpitations or shortness of breath. Past treatments include ACE inhibitors and diuretics. There are no compliance problems.  There is no history of kidney disease, CAD/MI or CVA.  Hyperlipidemia This is a chronic problem. The problem is uncontrolled. Recent lipid tests were reviewed and are high. Pertinent negatives include no chest pain or shortness of breath. Current antihyperlipidemic treatment includes statins.  Diabetes She presents for her follow-up diabetic visit. She has type 2 diabetes mellitus. Her disease course has been stable. Pertinent negatives for hypoglycemia include no dizziness, headaches, nervousness/anxiousness or tremors. Pertinent negatives for diabetes include no chest pain, no fatigue, no polydipsia and no polyuria. Pertinent negatives for diabetic  complications include no CVA. Current diabetic treatment includes oral agent (monotherapy) (metformin). Her weight is stable. She is following a generally healthy diet. An ACE inhibitor/angiotensin II receptor blocker is being taken. Eye exam is current.    Lab Results  Component Value Date   NA 141 08/12/2021   K 4.8 08/12/2021   CO2 22 08/12/2021   GLUCOSE 102 (H) 08/12/2021   BUN 9 08/12/2021   CREATININE 0.65 08/12/2021   CALCIUM 10.8 (H) 08/12/2021   EGFR 99 08/12/2021   GFRNONAA 94 04/16/2020   Lab Results  Component Value Date   CHOL 181 04/19/2021   HDL 38 (L) 04/19/2021   LDLCALC 123 (H) 04/19/2021   TRIG 109 04/19/2021   CHOLHDL 4.8 (H) 04/19/2021   Lab Results  Component Value Date   TSH 1.610 04/19/2021   Lab Results  Component Value Date   HGBA1C 6.1 (H) 08/12/2021   Lab Results  Component Value Date   WBC 4.6 04/19/2021   HGB 13.8 04/19/2021   HCT 41.3 04/19/2021   MCV 92 04/19/2021   PLT 290 04/19/2021   Lab Results  Component Value Date   ALT 15 08/12/2021   AST 17 08/12/2021   ALKPHOS 60 08/12/2021   BILITOT 0.4 08/12/2021   No results found for: "25OHVITD2", "25OHVITD3", "VD25OH"   Review of Systems  Constitutional:  Negative for chills, fatigue and fever.  HENT:  Negative for congestion, hearing loss, tinnitus, trouble swallowing and voice change.   Eyes:  Negative for visual disturbance.  Respiratory:  Negative for cough, chest tightness, shortness of breath and wheezing.   Cardiovascular:  Negative for chest pain, palpitations and leg swelling.  Gastrointestinal:  Negative for abdominal pain, constipation, diarrhea and vomiting.  Endocrine: Negative for polydipsia and polyuria.  Genitourinary:  Negative for dysuria, frequency, genital sores, vaginal bleeding and vaginal discharge.  Musculoskeletal:  Negative for arthralgias, gait problem and joint swelling.  Skin:  Negative for color change and rash.  Neurological:  Negative for  dizziness, tremors, light-headedness and headaches.  Hematological:  Negative for adenopathy. Does not bruise/bleed easily.  Psychiatric/Behavioral:  Negative for dysphoric mood and sleep disturbance. The patient is not nervous/anxious.     Patient Active Problem List   Diagnosis Date Noted   Retained intrauterine contraceptive device (IUD) 08/12/2021   Primary hyperparathyroidism ([place]) 08/12/2021   Type II diabetes mellitus with complication ([place]) 48/88/9169   Hyperlipidemia associated with type 2 diabetes mellitus ([place]) 08/11/2018   Special screening for malignant neoplasms, colon    Benign neoplasm of transverse colon    Tubular adenoma of colon    Coronary artery disease involving native heart 05/02/2015   Gastroesophageal reflux disease 05/02/2015   Benign hypertension 05/02/2015    No Known Allergies  Past Surgical History:  Procedure Laterality Date   CARPAL TUNNEL RELEASE Bilateral    right - 14 yrs ago.  left - 06/2017   COLONOSCOPY WITH PROPOFOL N/A 04/29/2018   Procedure: COLONOSCOPY WITH BIOPSY;  Surgeon: [name], MD;  Location: [facility];  Service: Endoscopy;  Laterality: N/A;   POLYPECTOMY N/A 04/29/2018   Procedure: POLYPECTOMY;  Surgeon: [name], MD;  Location: [facility];  Service: Endoscopy;  Laterality: N/A;    Social History   Tobacco Use   Smoking status: Never   Smokeless tobacco: Never  Vaping Use   Vaping Use: Never used  Substance Use Topics   Alcohol use: Yes    Comment: socially   Drug use: No     Medication list has been reviewed and updated.  Current Meds  Medication Sig   Ascorbic Acid (VITA-C PO) Take 1 tablet by mouth daily.   aspirin 81 MG tablet Take 1 tablet by mouth daily.   enalapril (VASOTEC) 10 MG tablet TAKE 1 TABLET BY MOUTH EVERY DAY   metFORMIN (GLUCOPHAGE-XR) 500 MG 24 hr tablet TAKE 1 TABLET BY MOUTH EVERY DAY WITH BREAKFAST   Multiple Vitamins-Minerals (ZINC PO) Take 1 tablet by mouth  daily.   rosuvastatin (CRESTOR) 10 MG tablet Take 1 tablet (10 mg total) by mouth daily.   spironolactone (ALDACTONE) 25 MG tablet TAKE 1 TABLET BY MOUTH EVERY DAY   VITAMIN D PO Take 1 tablet by mouth daily.   zinc gluconate 50 MG tablet Take 50 mg by mouth daily.       10/01/2021   11:46 AM 08/12/2021   10:56 AM 04/19/2021    9:54 AM 03/13/2021    9:10 AM  GAD 7 : Generalized Anxiety Score  Nervous, Anxious, on Edge 0 0 0 0  Control/stop worrying 0 0 0 0  Worry too much - different things 0 0 0 0  Trouble relaxing 0 0 0 0  Restless 0 0 0 0  Easily annoyed or irritable 0 0 0 0  Afraid - awful might happen 0 0 0 0  Total GAD 7 Score 0 0 0 0  Anxiety Difficulty   Not difficult at all        10/01/2021   11:46 AM 08/12/2021   10:56 AM 04/19/2021    9:53 AM  Depression screen PHQ 2/9  Decreased Interest 0 0 0  Down, Depressed, Hopeless 0 0 0  PHQ - 2 Score 0  0 0  Altered sleeping 3 3 0  Tired, decreased energy 0 0 0  Change in appetite 0 0 0  Feeling bad or failure about yourself  0 0 0  Trouble concentrating 0 0 0  Moving slowly or fidgety/restless 0 0 0  Suicidal thoughts 0 0 0  PHQ-9 Score 3 3 0  Difficult doing work/chores Not difficult at all Not difficult at all Not difficult at all    BP Readings from Last 3 Encounters:  04/24/22 118/72  10/01/21 138/88  09/22/21 130/89    Physical Exam Vitals and nursing note reviewed.  Constitutional:      General: She is not in acute distress.    Appearance: She is well-developed.  HENT:     Head: Normocephalic and atraumatic.     Right Ear: Tympanic membrane and ear canal normal.     Left Ear: Tympanic membrane and ear canal normal.     Nose:     Right Sinus: No maxillary sinus tenderness.     Left Sinus: No maxillary sinus tenderness.  Eyes:     General: No scleral icterus.       Right eye: No discharge.        Left eye: No discharge.     Conjunctiva/sclera: Conjunctivae normal.  Neck:     Thyroid: No  thyromegaly.     Vascular: No carotid bruit.  Cardiovascular:     Rate and Rhythm: Normal rate and regular rhythm.     Pulses: Normal pulses.     Heart sounds: Normal heart sounds.  Pulmonary:     Effort: Pulmonary effort is normal. No respiratory distress.     Breath sounds: No wheezing.  Chest:  Breasts:    Right: No mass, nipple discharge, skin change or tenderness.     Left: No mass, nipple discharge, skin change or tenderness.  Abdominal:     General: Bowel sounds are normal.     Palpations: Abdomen is soft.     Tenderness: There is no abdominal tenderness.  Musculoskeletal:     Cervical back: Normal range of motion. No erythema.     Right lower leg: No edema.     Left lower leg: No edema.  Lymphadenopathy:     Cervical: No cervical adenopathy.  Skin:    General: Skin is warm and dry.     Findings: No rash.  Neurological:     Mental Status: She is alert and oriented to person, place, and time.     Cranial Nerves: No cranial nerve deficit.     Sensory: No sensory deficit.     Deep Tendon Reflexes: Reflexes are normal and symmetric.  Psychiatric:        Attention and Perception: Attention normal.        Mood and Affect: Mood normal.     Wt Readings from Last 3 Encounters:  04/24/22 211 lb (95.7 kg)  10/01/21 213 lb (96.6 kg)  09/22/21 209 lb (94.8 kg)    BP 118/72 (BP Location: Right Arm, Patient Position: Sitting, Cuff Size: Large)   Pulse 79   Ht 5' 4.5" (1.638 m)   Wt 211 lb (95.7 kg)   SpO2 98%   BMI 35.66 kg/m   Assessment and Plan: 1. Annual physical exam Exam is normal except for weight. Continue regular exercise and appropriate dietary changes. Up to date on screenings and immunizations.  2. Encounter for screening mammogram for breast cancer Schedule at [facility]  3. Benign hypertension Clinically stable exam with well controlled BP. Tolerating medications without side effects at this time. Pt to continue current  regimen and low sodium diet; benefits of regular exercise as able discussed. - CBC with Differential/Platelet - TSH  4. Type II diabetes mellitus with complication (HCC) Clinically stable by exam and report without s/s of hypoglycemia. DM complicated by hypertension and dyslipidemia. Tolerating medications well without side effects or other concerns. - Comprehensive metabolic panel - Hemoglobin A1c - Microalbumin / creatinine urine ratio  5. Hyperlipidemia associated with type 2 diabetes mellitus ([place]) Tolerating statin medication without side effects at this time LDL is almost at goal of < 70 on current dose Continue same therapy without change at this time. - Lipid panel  6. Gastroesophageal reflux disease, unspecified whether esophagitis present Unchanged without red flag symptoms  7. Primary hyperparathyroidism ([place]) Asymptomatic - will continue to monitor - Comprehensive metabolic panel - Parathyroid hormone, intact (no Ca)  8. Need for diphtheria-tetanus-pertussis (Tdap) vaccine - Tdap vaccine greater than or equal to 7yo IM   Partially dictated using Editor, commissioning. Any errors are unintentional.  Halina Maidens, MD [facility]  04/24/2022

## 2022-04-26 LAB — CBC WITH DIFFERENTIAL/PLATELET
Basophils Absolute: 0 10*3/uL (ref 0.0–0.2)
Basos: 1 %
EOS (ABSOLUTE): 0.1 10*3/uL (ref 0.0–0.4)
Eos: 1 %
Hematocrit: 38.7 % (ref 34.0–46.6)
Hemoglobin: 12.9 g/dL (ref 11.1–15.9)
Immature Grans (Abs): 0 10*3/uL (ref 0.0–0.1)
Immature Granulocytes: 0 %
Lymphocytes Absolute: 1.7 10*3/uL (ref 0.7–3.1)
Lymphs: 39 %
MCH: 30.7 pg (ref 26.6–33.0)
MCHC: 33.3 g/dL (ref 31.5–35.7)
MCV: 92 fL (ref 79–97)
Monocytes Absolute: 0.5 10*3/uL (ref 0.1–0.9)
Monocytes: 11 %
Neutrophils Absolute: 2 10*3/uL (ref 1.4–7.0)
Neutrophils: 48 %
Platelets: 283 10*3/uL (ref 150–450)
RBC: 4.2 x10E6/uL (ref 3.77–5.28)
RDW: 12.1 % (ref 11.7–15.4)
WBC: 4.3 10*3/uL (ref 3.4–10.8)

## 2022-04-26 LAB — MICROALBUMIN / CREATININE URINE RATIO
Creatinine, Urine: 212.8 mg/dL
Microalb/Creat Ratio: 12 mg/g creat (ref 0–29)
Microalbumin, Urine: 26.4 ug/mL

## 2022-04-26 LAB — COMPREHENSIVE METABOLIC PANEL
ALT: 12 IU/L (ref 0–32)
AST: 16 IU/L (ref 0–40)
Albumin/Globulin Ratio: 1.8 (ref 1.2–2.2)
Albumin: 4.7 g/dL (ref 3.9–4.9)
Alkaline Phosphatase: 66 IU/L (ref 44–121)
BUN/Creatinine Ratio: 12 (ref 12–28)
BUN: 8 mg/dL (ref 8–27)
Bilirubin Total: 0.5 mg/dL (ref 0.0–1.2)
CO2: 23 mmol/L (ref 20–29)
Calcium: 11 mg/dL — ABNORMAL HIGH (ref 8.7–10.3)
Chloride: 104 mmol/L (ref 96–106)
Creatinine, Ser: 0.67 mg/dL (ref 0.57–1.00)
Globulin, Total: 2.6 g/dL (ref 1.5–4.5)
Glucose: 95 mg/dL (ref 70–99)
Potassium: 4.4 mmol/L (ref 3.5–5.2)
Sodium: 141 mmol/L (ref 134–144)
Total Protein: 7.3 g/dL (ref 6.0–8.5)
eGFR: 98 mL/min/{1.73_m2} (ref >59)

## 2022-04-26 LAB — LIPID PANEL
Chol/HDL Ratio: 3.9 ratio (ref 0.0–4.4)
Cholesterol, Total: 154 mg/dL (ref 100–199)
HDL: 40 mg/dL (ref >39)
LDL Chol Calc (NIH): 100 mg/dL — ABNORMAL HIGH (ref 0–99)
Triglycerides: 71 mg/dL (ref 0–149)
VLDL Cholesterol Cal: 14 mg/dL (ref 5–40)

## 2022-04-26 LAB — TSH: TSH: 1.51 u[IU]/mL (ref 0.450–4.500)

## 2022-04-26 LAB — HEMOGLOBIN A1C
Est. average glucose Bld gHb Est-mCnc: 131 mg/dL
Hgb A1c MFr Bld: 6.2 % — ABNORMAL HIGH (ref 4.8–5.6)

## 2022-04-26 LAB — PARATHYROID HORMONE, INTACT (NO CA): PTH: 28 pg/mL (ref 15–65)

## 2022-04-29 ENCOUNTER — Other Ambulatory Visit: Payer: Self-pay

## 2022-05-19 ENCOUNTER — Telehealth: Payer: Self-pay

## 2022-05-19 NOTE — Telephone Encounter (Signed)
Called pt and left VM reminding pt to call and schedule her mammogram.  <CM

## 2022-06-05 ENCOUNTER — Encounter: Payer: Self-pay | Admitting: Internal Medicine

## 2022-06-05 ENCOUNTER — Ambulatory Visit: Payer: Managed Care, Other (non HMO) | Admitting: Internal Medicine

## 2022-06-05 VITALS — BP 140/96 | HR 83 | Temp 98.6°F | Ht 64.5 in | Wt 214.6 lb

## 2022-06-05 DIAGNOSIS — R051 Acute cough: Secondary | ICD-10-CM

## 2022-06-05 DIAGNOSIS — I10 Essential (primary) hypertension: Secondary | ICD-10-CM | POA: Diagnosis not present

## 2022-06-05 DIAGNOSIS — J0100 Acute maxillary sinusitis, unspecified: Secondary | ICD-10-CM

## 2022-06-05 MED ORDER — AMOXICILLIN-POT CLAVULANATE 875-125 MG PO TABS: 1.0000 | ORAL_TABLET | Freq: Two times a day (BID) | ORAL | 0 refills | Status: AC

## 2022-06-05 MED ORDER — PROMETHAZINE-DM 6.25-15 MG/5ML PO SYRP
5.0000 mL | ORAL_SOLUTION | Freq: Four times a day (QID) | ORAL | 0 refills | Status: AC | PRN
Start: 2022-06-05 — End: 2022-06-14

## 2022-06-05 NOTE — Progress Notes (Signed)
Date:  06/05/2022   Name:  Molly Kelly   DOB:  1955-08-19   MRN:  [id number]   Chief Complaint: Cough (Started Saturday. Bringing up thick yellow mucous when coughing. No fever or SOB. )  Cough This is a new problem. The current episode started in the past 7 days. The problem has been unchanged. The problem occurs every few minutes. The cough is Productive of sputum. Associated symptoms include postnasal drip. Pertinent negatives include no ear pain, fever, headaches, sore throat, shortness of breath or sweats.    Lab Results  Component Value Date   NA 141 04/24/2022   K 4.4 04/24/2022   CO2 23 04/24/2022   GLUCOSE 95 04/24/2022   BUN 8 04/24/2022   CREATININE 0.67 04/24/2022   CALCIUM 11.0 (H) 04/24/2022   EGFR 98 04/24/2022   GFRNONAA 94 04/16/2020   Lab Results  Component Value Date   CHOL 154 04/24/2022   HDL 40 04/24/2022   LDLCALC 100 (H) 04/24/2022   TRIG 71 04/24/2022   CHOLHDL 3.9 04/24/2022   Lab Results  Component Value Date   TSH 1.510 04/24/2022   Lab Results  Component Value Date   HGBA1C 6.2 (H) 04/24/2022   Lab Results  Component Value Date   WBC 4.3 04/24/2022   HGB 12.9 04/24/2022   HCT 38.7 04/24/2022   MCV 92 04/24/2022   PLT 283 04/24/2022   Lab Results  Component Value Date   ALT 12 04/24/2022   AST 16 04/24/2022   ALKPHOS 66 04/24/2022   BILITOT 0.5 04/24/2022   No results found for: "25OHVITD2", "25OHVITD3", "VD25OH"   Review of Systems  Constitutional:  Negative for fever.  HENT:  Positive for postnasal drip. Negative for ear pain and sore throat.   Respiratory:  Positive for cough. Negative for shortness of breath.   Neurological:  Negative for headaches.    Patient Active Problem List   Diagnosis Date Noted   Retained intrauterine contraceptive device (IUD) 08/12/2021   Primary hyperparathyroidism ([place]) 08/12/2021   Type II diabetes mellitus with complication ([place]) 37/03/6268   Hyperlipidemia associated with type  2 diabetes mellitus ([place]) 08/11/2018   Special screening for malignant neoplasms, colon    Benign neoplasm of transverse colon    Tubular adenoma of colon    Coronary artery disease involving native heart 05/02/2015   Gastroesophageal reflux disease 05/02/2015   Benign hypertension 05/02/2015    No Known Allergies  Past Surgical History:  Procedure Laterality Date   CARPAL TUNNEL RELEASE Bilateral    right - 14 yrs ago.  left - 06/2017   COLONOSCOPY WITH PROPOFOL N/A 04/29/2018   Procedure: COLONOSCOPY WITH BIOPSY;  Surgeon: [name], MD;  Location: [facility];  Service: Endoscopy;  Laterality: N/A;   POLYPECTOMY N/A 04/29/2018   Procedure: POLYPECTOMY;  Surgeon: [name], MD;  Location: [facility];  Service: Endoscopy;  Laterality: N/A;    Social History   Tobacco Use   Smoking status: Never   Smokeless tobacco: Never  Vaping Use   Vaping Use: Never used  Substance Use Topics   Alcohol use: Yes    Comment: socially   Drug use: No     Medication list has been reviewed and updated.  Current Meds  Medication Sig   amoxicillin-clavulanate (AUGMENTIN) 875-125 MG tablet Take 1 tablet by mouth 2 (two) times daily for 10 days.   Ascorbic Acid (VITA-C PO) Take 1 tablet by mouth daily.   aspirin  81 MG tablet Take 1 tablet by mouth daily.   enalapril (VASOTEC) 10 MG tablet Take 1 tablet (10 mg total) by mouth daily.   metFORMIN (GLUCOPHAGE-XR) 500 MG 24 hr tablet Take 1 tablet (500 mg total) by mouth daily with breakfast.   Multiple Vitamins-Minerals (ZINC PO) Take 1 tablet by mouth daily.   promethazine-dextromethorphan (PROMETHAZINE-DM) 6.25-15 MG/5ML syrup Take 5 mLs by mouth 4 (four) times daily as needed for up to 9 days for cough.   rosuvastatin (CRESTOR) 10 MG tablet Take 1 tablet (10 mg total) by mouth daily.   spironolactone (ALDACTONE) 25 MG tablet Take 1 tablet (25 mg total) by mouth daily.   VITAMIN D PO Take 1 tablet by mouth daily.   zinc  gluconate 50 MG tablet Take 50 mg by mouth daily.       06/05/2022   10:51 AM 04/24/2022    9:44 AM 10/01/2021   11:46 AM 08/12/2021   10:56 AM  GAD 7 : Generalized Anxiety Score  Nervous, Anxious, on Edge 0 0 0 0  Control/stop worrying 0 0 0 0  Worry too much - different things 0 1 0 0  Trouble relaxing 0 0 0 0  Restless 0 0 0 0  Easily annoyed or irritable 0 0 0 0  Afraid - awful might happen 0 0 0 0  Total GAD 7 Score 0 1 0 0  Anxiety Difficulty Not difficult at all Not difficult at all         06/05/2022   10:51 AM 04/24/2022    9:44 AM 10/01/2021   11:46 AM  Depression screen PHQ 2/9  Decreased Interest 0 0 0  Down, Depressed, Hopeless 0 0 0  PHQ - 2 Score 0 0 0  Altered sleeping 0 0 3  Tired, decreased energy 0 0 0  Change in appetite 0 0 0  Feeling bad or failure about yourself  0 0 0  Trouble concentrating 0 0 0  Moving slowly or fidgety/restless 0 0 0  Suicidal thoughts 0 0 0  PHQ-9 Score 0 0 3  Difficult doing work/chores Not difficult at all Not difficult at all Not difficult at all    BP Readings from Last 3 Encounters:  06/05/22 (!) 140/96  04/24/22 118/72  10/01/21 138/88    Physical Exam Constitutional:      Appearance: She is well-developed.  HENT:     Right Ear: Ear canal and external ear normal. Tympanic membrane is not erythematous or retracted.     Left Ear: Ear canal and external ear normal. Tympanic membrane is not erythematous or retracted.     Nose:     Right Sinus: Maxillary sinus tenderness present. No frontal sinus tenderness.     Left Sinus: Maxillary sinus tenderness present. No frontal sinus tenderness.     Mouth/Throat:     Mouth: No oral lesions.     Pharynx: Uvula midline. No oropharyngeal exudate or posterior oropharyngeal erythema.  Cardiovascular:     Rate and Rhythm: Normal rate and regular rhythm.     Heart sounds: Normal heart sounds.  Pulmonary:     Effort: Pulmonary effort is normal.     Breath sounds: Normal breath  sounds. No wheezing or rales.  Musculoskeletal:     Cervical back: Normal range of motion.  Lymphadenopathy:     Cervical: No cervical adenopathy.  Neurological:     Mental Status: She is alert and oriented to person, place, and time.  Wt Readings from Last 3 Encounters:  06/05/22 214 lb 9.6 oz (97.3 kg)  04/24/22 211 lb (95.7 kg)  10/01/21 213 lb (96.6 kg)    BP (!) 140/96 (BP Location: Right Arm)   Pulse 83   Temp 98.6 F (37 C) (Oral)   Ht 5' 4.5" (1.638 m)   Wt 214 lb 9.6 oz (97.3 kg)   SpO2 97%   BMI 36.27 kg/m   Assessment and Plan: 1. Acute non-recurrent maxillary sinusitis Continue Mucinex, fluids - amoxicillin-clavulanate (AUGMENTIN) 875-125 MG tablet; Take 1 tablet by mouth 2 (two) times daily for 10 days.  Dispense: 20 tablet; Refill: 0  2. Acute cough - promethazine-dextromethorphan (PROMETHAZINE-DM) 6.25-15 MG/5ML syrup; Take 5 mLs by mouth 4 (four) times daily as needed for up to 9 days for cough.  Dispense: 118 mL; Refill: 0  3. Benign hypertension BP high today, even on repeat due to cough and cold medications She will monitor at home and follow up if not improving.   Partially dictated using Editor, commissioning. Any errors are unintentional.  Halina Maidens, MD [facility]  06/05/2022

## 2022-07-02 ENCOUNTER — Ambulatory Visit
Admission: RE | Admit: 2022-07-02 | Discharge: 2022-07-02 | Disposition: A | Discharge status: Home / Self Care | Payer: Managed Care, Other (non HMO) | Source: Ambulatory Visit | Attending: Internal Medicine | Admitting: Internal Medicine

## 2022-07-02 DIAGNOSIS — Z1231 Encounter for screening mammogram for malignant neoplasm of breast: Secondary | ICD-10-CM | POA: Diagnosis present

## 2022-07-30 LAB — HM DEXA SCAN: HM Dexa Scan: NORMAL

## 2022-08-26 ENCOUNTER — Encounter: Payer: Self-pay | Admitting: Internal Medicine

## 2022-08-26 ENCOUNTER — Ambulatory Visit: Payer: Managed Care, Other (non HMO) | Admitting: Internal Medicine

## 2022-08-26 VITALS — BP 122/90 | HR 85 | Ht 64.5 in | Wt 212.6 lb

## 2022-08-26 DIAGNOSIS — E785 Hyperlipidemia, unspecified: Secondary | ICD-10-CM

## 2022-08-26 DIAGNOSIS — E1169 Type 2 diabetes mellitus with other specified complication: Secondary | ICD-10-CM | POA: Diagnosis not present

## 2022-08-26 DIAGNOSIS — I10 Essential (primary) hypertension: Secondary | ICD-10-CM | POA: Diagnosis not present

## 2022-08-26 DIAGNOSIS — E118 Type 2 diabetes mellitus with unspecified complications: Secondary | ICD-10-CM

## 2022-08-26 DIAGNOSIS — E210 Primary hyperparathyroidism: Secondary | ICD-10-CM | POA: Diagnosis not present

## 2022-08-26 DIAGNOSIS — Z6835 Body mass index (BMI) 35.0-35.9, adult: Secondary | ICD-10-CM

## 2022-08-26 LAB — POCT GLYCOSYLATED HEMOGLOBIN (HGB A1C): Hemoglobin A1C: 6.4 % — AB (ref 4.0–5.6)

## 2022-08-26 MED ORDER — PEN NEEDLES 32G X 5 MM MISC: 1.0000 | 0 refills | Status: DC

## 2022-08-26 MED ORDER — SEMAGLUTIDE (1 MG/DOSE) 4 MG/3ML ~~LOC~~ SOPN
1.0000 mg | PEN_INJECTOR | SUBCUTANEOUS | 0 refills | Status: DC
Start: 2022-08-26 — End: 2022-09-22

## 2022-08-26 NOTE — Assessment & Plan Note (Signed)
Workup by [facility]

## 2022-08-26 NOTE — Assessment & Plan Note (Signed)
[facility] workup in progress

## 2022-08-26 NOTE — Progress Notes (Signed)
Date:  08/26/2022   Name:  Molly Kelly   DOB:  1956-05-21   MRN:  [id number]   Chief Complaint: Diabetes  Diabetes She presents for her follow-up diabetic visit. She has type 2 diabetes mellitus. Pertinent negatives for hypoglycemia include no dizziness or headaches. Pertinent negatives for diabetes include no chest pain, no fatigue and no weakness. Symptoms are stable. Current diabetic treatment includes oral agent (monotherapy) (metformin).  Hypertension This is a chronic problem. The problem is controlled. Pertinent negatives include no chest pain, headaches, palpitations or shortness of breath. Past treatments include ACE inhibitors and diuretics.    Lab Results  Component Value Date   NA 141 04/24/2022   K 4.4 04/24/2022   CO2 23 04/24/2022   GLUCOSE 95 04/24/2022   BUN 8 04/24/2022   CREATININE 0.67 04/24/2022   CALCIUM 11.0 (H) 04/24/2022   EGFR 98 04/24/2022   GFRNONAA 94 04/16/2020   Lab Results  Component Value Date   CHOL 154 04/24/2022   HDL 40 04/24/2022   LDLCALC 100 (H) 04/24/2022   TRIG 71 04/24/2022   CHOLHDL 3.9 04/24/2022   Lab Results  Component Value Date   TSH 1.510 04/24/2022   Lab Results  Component Value Date   HGBA1C 6.4 (A) 08/26/2022   Lab Results  Component Value Date   WBC 4.3 04/24/2022   HGB 12.9 04/24/2022   HCT 38.7 04/24/2022   MCV 92 04/24/2022   PLT 283 04/24/2022   Lab Results  Component Value Date   ALT 12 04/24/2022   AST 16 04/24/2022   ALKPHOS 66 04/24/2022   BILITOT 0.5 04/24/2022   No results found for: "25OHVITD2", "25OHVITD3", "VD25OH"   Review of Systems  Constitutional:  Negative for fatigue and unexpected weight change.  HENT:  Negative for nosebleeds.   Eyes:  Negative for visual disturbance.  Respiratory:  Negative for cough, chest tightness, shortness of breath and wheezing.   Cardiovascular:  Negative for chest pain, palpitations and leg swelling.  Gastrointestinal:  Negative for abdominal  pain, constipation and diarrhea.  Neurological:  Negative for dizziness, weakness, light-headedness and headaches.    Patient Active Problem List   Diagnosis Date Noted   Retained intrauterine contraceptive device (IUD) 08/12/2021   Primary hyperparathyroidism ([place]) 08/12/2021   Type II diabetes mellitus with complication ([place]) [id number]   Hyperlipidemia associated with type 2 diabetes mellitus ([place]) 08/11/2018   Special screening for malignant neoplasms, colon    Benign neoplasm of transverse colon    Tubular adenoma of colon    Coronary artery disease involving native heart 05/02/2015   Gastroesophageal reflux disease 05/02/2015   Benign hypertension 05/02/2015    No Known Allergies  Past Surgical History:  Procedure Laterality Date   CARPAL TUNNEL RELEASE Bilateral    right - 14 yrs ago.  left - 06/2017   COLONOSCOPY WITH PROPOFOL N/A 04/29/2018   Procedure: COLONOSCOPY WITH BIOPSY;  Surgeon: [name], MD;  Location: [facility];  Service: Endoscopy;  Laterality: N/A;   POLYPECTOMY N/A 04/29/2018   Procedure: POLYPECTOMY;  Surgeon: [name], MD;  Location: [facility];  Service: Endoscopy;  Laterality: N/A;    Social History   Tobacco Use   Smoking status: Never   Smokeless tobacco: Never  Vaping Use   Vaping Use: Never used  Substance Use Topics   Alcohol use: Yes    Comment: socially   Drug use: No     Medication list has been reviewed and  updated.  Current Meds  Medication Sig   Ascorbic Acid (VITA-C PO) Take 1 tablet by mouth daily.   aspirin 81 MG tablet Take 1 tablet by mouth daily.   enalapril (VASOTEC) 10 MG tablet Take 1 tablet (10 mg total) by mouth daily.   Insulin Pen Needle (PEN NEEDLES) 32G X 5 MM MISC 1 each by Does not apply route once a week.   metFORMIN (GLUCOPHAGE-XR) 500 MG 24 hr tablet Take 1 tablet (500 mg total) by mouth daily with breakfast.   Multiple Vitamins-Minerals (ZINC PO) Take 1 tablet by mouth daily.    rosuvastatin (CRESTOR) 10 MG tablet Take 1 tablet (10 mg total) by mouth daily.   Semaglutide, 1 MG/DOSE, 4 MG/3ML SOPN Inject 1 mg as directed once a week.   spironolactone (ALDACTONE) 25 MG tablet Take 1 tablet (25 mg total) by mouth daily.   VITAMIN D PO Take 1 tablet by mouth daily.   zinc gluconate 50 MG tablet Take 50 mg by mouth daily.       08/26/2022    9:40 AM 06/05/2022   10:51 AM 04/24/2022    9:44 AM 10/01/2021   11:46 AM  GAD 7 : Generalized Anxiety Score  Nervous, Anxious, on Edge 0 0 0 0  Control/stop worrying 0 0 0 0  Worry too much - different things 0 0 1 0  Trouble relaxing 0 0 0 0  Restless 0 0 0 0  Easily annoyed or irritable 0 0 0 0  Afraid - awful might happen 0 0 0 0  Total GAD 7 Score 0 0 1 0  Anxiety Difficulty Not difficult at all Not difficult at all Not difficult at all        08/26/2022    9:40 AM 06/05/2022   10:51 AM 04/24/2022    9:44 AM  Depression screen PHQ 2/9  Decreased Interest 0 0 0  Down, Depressed, Hopeless 0 0 0  PHQ - 2 Score 0 0 0  Altered sleeping 2 0 0  Tired, decreased energy 0 0 0  Change in appetite 0 0 0  Feeling bad or failure about yourself  0 0 0  Trouble concentrating 0 0 0  Moving slowly or fidgety/restless 0 0 0  Suicidal thoughts 0 0 0  PHQ-9 Score 2 0 0  Difficult doing work/chores Not difficult at all Not difficult at all Not difficult at all    BP Readings from Last 3 Encounters:  08/26/22 (!) 122/90  06/05/22 (!) 140/96  04/24/22 118/72    Physical Exam Vitals and nursing note reviewed.  Constitutional:      General: She is not in acute distress.    Appearance: She is well-developed.  HENT:     Head: Normocephalic and atraumatic.  Cardiovascular:     Rate and Rhythm: Normal rate and regular rhythm.  Pulmonary:     Effort: Pulmonary effort is normal. No respiratory distress.  Musculoskeletal:     Cervical back: Normal range of motion.     Right lower leg: No edema.     Left lower leg: No edema.   Skin:    General: Skin is warm and dry.     Findings: No rash.  Neurological:     Mental Status: She is alert and oriented to person, place, and time.  Psychiatric:        Mood and Affect: Mood normal.        Behavior: Behavior normal.     Wt  Readings from Last 3 Encounters:  08/26/22 212 lb 9.6 oz (96.4 kg)  06/05/22 214 lb 9.6 oz (97.3 kg)  04/24/22 211 lb (95.7 kg)    BP (!) 122/90   Pulse 85   Ht 5' 4.5" (1.638 m)   Wt 212 lb 9.6 oz (96.4 kg)   SpO2 97%   BMI 35.93 kg/m   Assessment and Plan: Problem List Items Addressed This Visit       Cardiovascular and Mediastinum   Benign hypertension (Chronic)    Clinically stable exam with well controlled BP on enalapril and spironolactone. Tolerating medications without side effects. Pt to continue current regimen and low sodium diet.         Endocrine   Hyperlipidemia associated with type 2 diabetes mellitus (HCC) (Chronic)   Relevant Medications   Semaglutide, 1 MG/DOSE, 4 MG/3ML SOPN   Primary hyperparathyroidism (HCC) (Chronic)    Workup by [facility]      Type II diabetes mellitus with complication (HCC) - Primary (Chronic)    Clinically stable without s/s of hypoglycemia. Tolerating metformin well without side effects or other concerns. She is struggling to lose weight despite exercise and diet changes. Will start Ozempic. Lab Results  Component Value Date   HGBA1C 6.2 (H) 04/24/2022        Relevant Medications   Insulin Pen Needle (PEN NEEDLES) 32G X 5 MM MISC   Semaglutide, 1 MG/DOSE, 4 MG/3ML SOPN   Other Relevant Orders   POCT glycosylated hemoglobin (Hb A1C) (Completed)   Other Visit Diagnoses     BMI 35.0-35.9,adult            Partially dictated using Editor, commissioning. Any errors are unintentional.  Halina Maidens, MD [facility]  08/26/2022

## 2022-08-26 NOTE — Assessment & Plan Note (Signed)
Clinically stable exam with well controlled BP on enalapril and spironolactone. Tolerating medications without side effects. Pt to continue current regimen and low sodium diet.

## 2022-08-26 NOTE — Assessment & Plan Note (Addendum)
Clinically stable without s/s of hypoglycemia. Tolerating metformin well without side effects or other concerns. She is struggling to lose weight despite exercise and diet changes. Will start Ozempic. Lab Results  Component Value Date   HGBA1C 6.2 (H) 04/24/2022

## 2022-09-22 ENCOUNTER — Telehealth: Payer: Self-pay | Admitting: Internal Medicine

## 2022-09-22 ENCOUNTER — Other Ambulatory Visit: Payer: Self-pay

## 2022-09-22 DIAGNOSIS — E118 Type 2 diabetes mellitus with unspecified complications: Secondary | ICD-10-CM

## 2022-09-22 MED ORDER — SEMAGLUTIDE (1 MG/DOSE) 4 MG/3ML ~~LOC~~ SOPN: 1.0000 mg | PEN_INJECTOR | SUBCUTANEOUS | 0 refills | Status: DC

## 2022-09-22 NOTE — Telephone Encounter (Signed)
Patient requesting refill on [facility] [place]. Requesting 90 days. Sent.  Lynze Reddy

## 2022-09-22 NOTE — Telephone Encounter (Signed)
Copied from [place] [id number]. Topic: General - Other >> Sep 22, 2022  9:19 AM Cyndi Bender wrote: Reason for CRM: Pt requests that [facility] return her call at [phone]

## 2022-09-30 ENCOUNTER — Ambulatory Visit
Admission: RE | Admit: 2022-09-30 | Discharge: 2022-09-30 | Disposition: A | Discharge status: Home / Self Care | Payer: Managed Care, Other (non HMO) | Source: Ambulatory Visit | Attending: Internal Medicine | Admitting: Internal Medicine

## 2022-09-30 ENCOUNTER — Ambulatory Visit: Payer: Managed Care, Other (non HMO) | Admitting: Internal Medicine

## 2022-09-30 ENCOUNTER — Ambulatory Visit
Admission: RE | Admit: 2022-09-30 | Discharge: 2022-09-30 | Disposition: A | Discharge status: Home / Self Care | Payer: Managed Care, Other (non HMO) | Attending: Internal Medicine | Admitting: Internal Medicine

## 2022-09-30 ENCOUNTER — Encounter: Payer: Self-pay | Admitting: Internal Medicine

## 2022-09-30 VITALS — BP 128/80 | HR 90 | Ht 64.5 in | Wt 213.0 lb

## 2022-09-30 DIAGNOSIS — M25561 Pain in right knee: Secondary | ICD-10-CM | POA: Diagnosis not present

## 2022-09-30 MED ORDER — MELOXICAM 15 MG PO TABS: 15.0000 mg | ORAL_TABLET | Freq: Every day | ORAL | 0 refills | Status: DC

## 2022-09-30 NOTE — Progress Notes (Signed)
Date:  09/30/2022   Name:  Molly Kelly   DOB:  May 20, 1956   MRN:  [id number]   Chief Complaint: Knee Pain (Right knee - Started 2 weeks ago. Getting worse. Noticed the swelling over the weekend. Painful to walk on it at times. Patient using heating pads and this helps. Saturday she said she used a Cane to help her walk. Patient had Xrays over 10 years ago of her knee. Patient has had cortisone shots in the past. No injury mechanism. )  Knee Pain  There was no injury mechanism. The pain is present in the right knee. The quality of the pain is described as aching. The pain is moderate. The pain has been Fluctuating since onset. She has tried heat (topical rubs) for the symptoms. The treatment provided moderate relief.    Lab Results  Component Value Date   NA 141 04/24/2022   K 4.4 04/24/2022   CO2 23 04/24/2022   GLUCOSE 95 04/24/2022   BUN 8 04/24/2022   CREATININE 0.67 04/24/2022   CALCIUM 11.0 (H) 04/24/2022   EGFR 98 04/24/2022   GFRNONAA 94 04/16/2020   Lab Results  Component Value Date   CHOL 154 04/24/2022   HDL 40 04/24/2022   LDLCALC 100 (H) 04/24/2022   TRIG 71 04/24/2022   CHOLHDL 3.9 04/24/2022   Lab Results  Component Value Date   TSH 1.510 04/24/2022   Lab Results  Component Value Date   HGBA1C 6.4 (A) 08/26/2022   Lab Results  Component Value Date   WBC 4.3 04/24/2022   HGB 12.9 04/24/2022   HCT 38.7 04/24/2022   MCV 92 04/24/2022   PLT 283 04/24/2022   Lab Results  Component Value Date   ALT 12 04/24/2022   AST 16 04/24/2022   ALKPHOS 66 04/24/2022   BILITOT 0.5 04/24/2022   No results found for: "25OHVITD2", "25OHVITD3", "VD25OH"   Review of Systems  Constitutional:  Negative for fatigue and unexpected weight change.  HENT:  Negative for nosebleeds.   Eyes:  Negative for visual disturbance.  Respiratory:  Negative for cough, chest tightness, shortness of breath and wheezing.   Cardiovascular:  Negative for chest pain, palpitations  and leg swelling.  Gastrointestinal:  Negative for abdominal pain, constipation and diarrhea.  Musculoskeletal:  Positive for arthralgias, gait problem and joint swelling.  Neurological:  Negative for dizziness, weakness, light-headedness and headaches.    Patient Active Problem List   Diagnosis Date Noted   Retained intrauterine contraceptive device (IUD) 08/12/2021   Primary hyperparathyroidism 08/12/2021   Type II diabetes mellitus with complication [id number]   Hyperlipidemia associated with type 2 diabetes mellitus 08/11/2018   Special screening for malignant neoplasms, colon    Benign neoplasm of transverse colon    Tubular adenoma of colon    Coronary artery disease involving native heart 05/02/2015   Gastroesophageal reflux disease 05/02/2015   Benign hypertension 05/02/2015    No Known Allergies  Past Surgical History:  Procedure Laterality Date   CARPAL TUNNEL RELEASE Bilateral    right - 14 yrs ago.  left - 06/2017   COLONOSCOPY WITH PROPOFOL N/A 04/29/2018   Procedure: COLONOSCOPY WITH BIOPSY;  Surgeon: [name], MD;  Location: [facility];  Service: Endoscopy;  Laterality: N/A;   POLYPECTOMY N/A 04/29/2018   Procedure: POLYPECTOMY;  Surgeon: [name], MD;  Location: [facility];  Service: Endoscopy;  Laterality: N/A;    Social History   Tobacco Use   Smoking status:  Never   Smokeless tobacco: Never  Vaping Use   Vaping Use: Never used  Substance Use Topics   Alcohol use: Yes    Comment: socially   Drug use: No     Medication list has been reviewed and updated.  Current Meds  Medication Sig   Ascorbic Acid (VITA-C PO) Take 1 tablet by mouth daily.   aspirin 81 MG tablet Take 1 tablet by mouth daily.   enalapril (VASOTEC) 10 MG tablet Take 1 tablet (10 mg total) by mouth daily.   Insulin Pen Needle (PEN NEEDLES) 32G X 5 MM MISC 1 each by Does not apply route once a week.   meloxicam (MOBIC) 15 MG tablet Take 1 tablet (15 mg  total) by mouth daily.   metFORMIN (GLUCOPHAGE-XR) 500 MG 24 hr tablet Take 1 tablet (500 mg total) by mouth daily with breakfast.   Multiple Vitamins-Minerals (ZINC PO) Take 1 tablet by mouth daily.   rosuvastatin (CRESTOR) 10 MG tablet Take 1 tablet (10 mg total) by mouth daily.   Semaglutide, 1 MG/DOSE, 4 MG/3ML SOPN Inject 1 mg as directed once a week.   spironolactone (ALDACTONE) 25 MG tablet Take 1 tablet (25 mg total) by mouth daily.   VITAMIN D PO Take 1 tablet by mouth daily.   zinc gluconate 50 MG tablet Take 50 mg by mouth daily.       09/30/2022    9:40 AM 08/26/2022    9:40 AM 06/05/2022   10:51 AM 04/24/2022    9:44 AM  GAD 7 : Generalized Anxiety Score  Nervous, Anxious, on Edge 0 0 0 0  Control/stop worrying 0 0 0 0  Worry too much - different things 0 0 0 1  Trouble relaxing 0 0 0 0  Restless 0 0 0 0  Easily annoyed or irritable 0 0 0 0  Afraid - awful might happen 0 0 0 0  Total GAD 7 Score 0 0 0 1  Anxiety Difficulty Not difficult at all Not difficult at all Not difficult at all Not difficult at all       09/30/2022    9:40 AM 08/26/2022    9:40 AM 06/05/2022   10:51 AM  Depression screen PHQ 2/9  Decreased Interest 0 0 0  Down, Depressed, Hopeless 0 0 0  PHQ - 2 Score 0 0 0  Altered sleeping 0 2 0  Tired, decreased energy 0 0 0  Change in appetite 0 0 0  Feeling bad or failure about yourself  0 0 0  Trouble concentrating 0 0 0  Moving slowly or fidgety/restless 0 0 0  Suicidal thoughts 0 0 0  PHQ-9 Score 0 2 0  Difficult doing work/chores Not difficult at all Not difficult at all Not difficult at all    BP Readings from Last 3 Encounters:  09/30/22 128/80  08/26/22 (!) 122/90  06/05/22 (!) 140/96    Physical Exam Vitals and nursing note reviewed.  Constitutional:      General: She is not in acute distress.    Appearance: She is well-developed.  HENT:     Head: Normocephalic and atraumatic.  Pulmonary:     Effort: Pulmonary effort is normal. No  respiratory distress.  Musculoskeletal:     Right knee: Swelling and crepitus present. No effusion, erythema, ecchymosis or bony tenderness. Normal range of motion.  Skin:    General: Skin is warm and dry.     Findings: No rash.  Neurological:  Mental Status: She is alert and oriented to person, place, and time.  Psychiatric:        Mood and Affect: Mood normal.        Behavior: Behavior normal.     Wt Readings from Last 3 Encounters:  09/30/22 213 lb (96.6 kg)  08/26/22 212 lb 9.6 oz (96.4 kg)  06/05/22 214 lb 9.6 oz (97.3 kg)    BP 128/80   Pulse 90   Ht 5' 4.5" (1.638 m)   Wt 213 lb (96.6 kg)   SpO2 99%   BMI 36.00 kg/m   Assessment and Plan:  Problem List Items Addressed This Visit   None Visit Diagnoses     Acute pain of right knee    -  Primary   suspect mild OA; doubt gout/infection will get imaging start Mobic 15 mg daily 2-4 weeks   Relevant Medications   meloxicam (MOBIC) 15 MG tablet   Other Relevant Orders   DG Knee Complete 4 Views Right       No follow-ups on file.   Partially dictated using [place], any errors are not intentional.  Glean Hess, MD [place], [place]

## 2022-10-27 ENCOUNTER — Other Ambulatory Visit: Payer: Self-pay | Admitting: Internal Medicine

## 2022-10-27 DIAGNOSIS — M25561 Pain in right knee: Secondary | ICD-10-CM

## 2022-11-18 ENCOUNTER — Telehealth: Payer: Self-pay

## 2022-11-18 NOTE — Telephone Encounter (Signed)
Received FAX from [facility] for med refills for spironolactone, enalapril, rosuvastatin, and metformin. Called patient and left VM asking patient to call back and let us know if she is changing her pharmacy [facility]  - Kellen Dutch

## 2022-11-26 ENCOUNTER — Other Ambulatory Visit: Payer: Self-pay

## 2022-11-26 DIAGNOSIS — I10 Essential (primary) hypertension: Secondary | ICD-10-CM

## 2022-11-26 DIAGNOSIS — E118 Type 2 diabetes mellitus with unspecified complications: Secondary | ICD-10-CM

## 2022-11-26 DIAGNOSIS — M25561 Pain in right knee: Secondary | ICD-10-CM

## 2022-11-26 DIAGNOSIS — E1169 Type 2 diabetes mellitus with other specified complication: Secondary | ICD-10-CM

## 2022-11-26 MED ORDER — METFORMIN HCL ER 500 MG PO TB24: 500.0000 mg | ORAL_TABLET | Freq: Every day | ORAL | 0 refills | Status: DC

## 2022-11-26 MED ORDER — ROSUVASTATIN CALCIUM 10 MG PO TABS: 10.0000 mg | ORAL_TABLET | Freq: Every day | ORAL | 0 refills | Status: DC

## 2022-11-26 MED ORDER — ENALAPRIL MALEATE 10 MG PO TABS: 10.0000 mg | ORAL_TABLET | Freq: Every day | ORAL | 0 refills | Status: DC

## 2022-11-26 MED ORDER — SEMAGLUTIDE (1 MG/DOSE) 4 MG/3ML ~~LOC~~ SOPN: 1.0000 mg | PEN_INJECTOR | SUBCUTANEOUS | 0 refills | Status: DC

## 2022-11-26 MED ORDER — SPIRONOLACTONE 25 MG PO TABS: 25.0000 mg | ORAL_TABLET | Freq: Every day | ORAL | 0 refills | Status: DC

## 2022-11-26 MED ORDER — MELOXICAM 15 MG PO TABS: 15.0000 mg | ORAL_TABLET | Freq: Every day | ORAL | 0 refills | Status: DC

## 2022-11-26 NOTE — Telephone Encounter (Signed)
Pt returned call stated yes, she is changing her pharmacy. When she turned 66, she was given a different insurance to get at the same price. She has to go through [employer] [facility] [employer] order.  Stated, please send over all her medication. Including meloxicam (MOBIC) 15 MG tablet, Semaglutide, 1 MG/DOSE, 4 MG/3ML SOPN.  Please advise.

## 2022-11-26 NOTE — Telephone Encounter (Signed)
Meds sent to [facility] 

## 2022-12-02 ENCOUNTER — Telehealth: Payer: Self-pay | Admitting: Internal Medicine

## 2022-12-02 ENCOUNTER — Other Ambulatory Visit: Payer: Self-pay

## 2022-12-02 DIAGNOSIS — M25561 Pain in right knee: Secondary | ICD-10-CM

## 2022-12-02 DIAGNOSIS — E118 Type 2 diabetes mellitus with unspecified complications: Secondary | ICD-10-CM

## 2022-12-02 MED ORDER — MELOXICAM 15 MG PO TABS: 15.0000 mg | ORAL_TABLET | Freq: Every day | ORAL | 0 refills | Status: DC

## 2022-12-02 MED ORDER — SEMAGLUTIDE (1 MG/DOSE) 4 MG/3ML ~~LOC~~ SOPN
1.0000 mg | PEN_INJECTOR | SUBCUTANEOUS | 1 refills | Status: DC
Start: 2022-12-02 — End: 2023-01-26

## 2022-12-02 NOTE — Telephone Encounter (Signed)
Pt is currently taking Meloxicam and Pt has changed her insurance because of her age and the insurance switched her to another agency that'll pay for her prescription and she needs either Dr. Judithann Graves or her nurse to call in a prescription for 90 days instead of 30 days. It needs to go to the mail order pharmacy, [phone]. She also wanted to let the nurse know that she needs the [facility] in a month-to-month dose because the mail order service is more expensive.

## 2022-12-02 NOTE — Telephone Encounter (Signed)
Patient informed of [facility] sent [facility], and Meloxicam sent for 90 day supply to [facility].

## 2022-12-30 ENCOUNTER — Ambulatory Visit (INDEPENDENT_AMBULATORY_CARE_PROVIDER_SITE_OTHER): Payer: Medicare Other | Admitting: Internal Medicine

## 2022-12-30 ENCOUNTER — Encounter: Payer: Self-pay | Admitting: Internal Medicine

## 2022-12-30 VITALS — BP 124/79 | HR 86 | Ht 64.5 in | Wt 200.2 lb

## 2022-12-30 DIAGNOSIS — Z7984 Long term (current) use of oral hypoglycemic drugs: Secondary | ICD-10-CM

## 2022-12-30 DIAGNOSIS — I10 Essential (primary) hypertension: Secondary | ICD-10-CM

## 2022-12-30 DIAGNOSIS — E118 Type 2 diabetes mellitus with unspecified complications: Secondary | ICD-10-CM | POA: Diagnosis not present

## 2022-12-30 DIAGNOSIS — Z23 Encounter for immunization: Secondary | ICD-10-CM | POA: Diagnosis not present

## 2022-12-30 DIAGNOSIS — E210 Primary hyperparathyroidism: Secondary | ICD-10-CM

## 2022-12-30 LAB — POCT GLYCOSYLATED HEMOGLOBIN (HGB A1C): Hemoglobin A1C: 5.8 % — AB (ref 4.0–5.6)

## 2022-12-30 NOTE — Assessment & Plan Note (Signed)
Recently seen by Endocrinology Plan for watchful waiting - has follow up in October

## 2022-12-30 NOTE — Assessment & Plan Note (Addendum)
Blood sugars stable without hypoglycemic symptoms or events. Currently being treated with metformin and Ozempic. She has lost 13 lbs since her last visit. Lab Results  Component Value Date   HGBA1C 6.4 (A) 08/26/2022  A1C today is 5.8

## 2022-12-30 NOTE — Assessment & Plan Note (Signed)
Stable exam with well controlled BP.  Currently taking enalapril and spironolactone. Tolerating medications without concerns or side effects. Will continue to recommend low sodium diet and current regimen.

## 2022-12-30 NOTE — Progress Notes (Signed)
Date:  12/30/2022   Name:  Molly Kelly   DOB:  10-Feb-1956   MRN:  [id number]   Chief Complaint: Diabetes and Hypertension  Diabetes She presents for her follow-up diabetic visit. She has type 2 diabetes mellitus. Her disease course has been stable. Pertinent negatives for hypoglycemia include no headaches or tremors. Pertinent negatives for diabetes include no chest pain, no fatigue, no polydipsia and no polyuria. Current diabetic treatment includes oral agent (monotherapy) (and Ozempic). She is compliant with treatment all of the time.  Hypertension This is a chronic problem. The problem is controlled. Pertinent negatives include no chest pain, headaches, palpitations or shortness of breath.  HyperPTH - doing well without complaints of kidney stones or bone issues. Seen by Endo - plans for q 6 mo follow up and discuss surgery if worsening.  Lab Results  Component Value Date   NA 141 04/24/2022   K 4.4 04/24/2022   CO2 23 04/24/2022   GLUCOSE 95 04/24/2022   BUN 8 04/24/2022   CREATININE 0.67 04/24/2022   CALCIUM 11.0 (H) 04/24/2022   EGFR 98 04/24/2022   GFRNONAA 94 04/16/2020   Lab Results  Component Value Date   CHOL 154 04/24/2022   HDL 40 04/24/2022   LDLCALC 100 (H) 04/24/2022   TRIG 71 04/24/2022   CHOLHDL 3.9 04/24/2022   Lab Results  Component Value Date   TSH 1.510 04/24/2022   Lab Results  Component Value Date   HGBA1C 5.8 (A) 12/30/2022   Lab Results  Component Value Date   WBC 4.3 04/24/2022   HGB 12.9 04/24/2022   HCT 38.7 04/24/2022   MCV 92 04/24/2022   PLT 283 04/24/2022   Lab Results  Component Value Date   ALT 12 04/24/2022   AST 16 04/24/2022   ALKPHOS 66 04/24/2022   BILITOT 0.5 04/24/2022   No results found for: "25OHVITD2", "25OHVITD3", "VD25OH"   Review of Systems  Constitutional:  Negative for appetite change, fatigue, fever and unexpected weight change.  HENT:  Negative for tinnitus and trouble swallowing.   Eyes:   Negative for visual disturbance.  Respiratory:  Negative for cough, chest tightness and shortness of breath.   Cardiovascular:  Negative for chest pain, palpitations and leg swelling.  Gastrointestinal:  Negative for abdominal pain.  Endocrine: Negative for polydipsia and polyuria.  Genitourinary:  Negative for dysuria and hematuria.  Musculoskeletal:  Negative for arthralgias.  Neurological:  Negative for tremors, numbness and headaches.  Psychiatric/Behavioral:  Negative for dysphoric mood.     Patient Active Problem List   Diagnosis Date Noted   Retained intrauterine contraceptive device (IUD) 08/12/2021   Primary hyperparathyroidism (HCC) 08/12/2021   Type II diabetes mellitus with complication (HCC) 04/20/2019   Hyperlipidemia associated with type 2 diabetes mellitus (HCC) 08/11/2018   Special screening for malignant neoplasms, colon    Benign neoplasm of transverse colon    Tubular adenoma of colon    Coronary artery disease involving native heart 05/02/2015   Gastroesophageal reflux disease 05/02/2015   Benign hypertension 05/02/2015    No Known Allergies  Past Surgical History:  Procedure Laterality Date   CARPAL TUNNEL RELEASE Bilateral    right - 14 yrs ago.  left - 06/2017   COLONOSCOPY WITH PROPOFOL N/A 04/29/2018   Procedure: COLONOSCOPY WITH BIOPSY;  Surgeon: Midge Minium, MD;  Location: [facility];  Service: Endoscopy;  Laterality: N/A;   POLYPECTOMY N/A 04/29/2018   Procedure: POLYPECTOMY;  Surgeon: Midge Minium, MD;  Location: [facility];  Service: Endoscopy;  Laterality: N/A;    Social History   Tobacco Use   Smoking status: Never   Smokeless tobacco: Never  Vaping Use   Vaping Use: Never used  Substance Use Topics   Alcohol use: Yes    Comment: socially   Drug use: No     Medication list has been reviewed and updated.  Current Meds  Medication Sig   aspirin 81 MG tablet Take 1 tablet by mouth daily.   enalapril (VASOTEC) 10  MG tablet Take 1 tablet (10 mg total) by mouth daily.   Insulin Pen Needle (PEN NEEDLES) 32G X 5 MM MISC 1 each by Does not apply route once a week.   meloxicam (MOBIC) 15 MG tablet Take 1 tablet (15 mg total) by mouth daily.   metFORMIN (GLUCOPHAGE-XR) 500 MG 24 hr tablet Take 1 tablet (500 mg total) by mouth daily with breakfast.   Multiple Vitamins-Minerals (ZINC PO) Take 1 tablet by mouth daily.   rosuvastatin (CRESTOR) 10 MG tablet Take 1 tablet (10 mg total) by mouth daily.   Semaglutide, 1 MG/DOSE, 4 MG/3ML SOPN Inject 1 mg as directed once a week.   spironolactone (ALDACTONE) 25 MG tablet Take 1 tablet (25 mg total) by mouth daily.   VITAMIN D PO Take 1 tablet by mouth daily.   zinc gluconate 50 MG tablet Take 50 mg by mouth daily.   [DISCONTINUED] Ascorbic Acid (VITA-C PO) Take 1 tablet by mouth daily.       09/30/2022    9:40 AM 08/26/2022    9:40 AM 06/05/2022   10:51 AM 04/24/2022    9:44 AM  GAD 7 : Generalized Anxiety Score  Nervous, Anxious, on Edge 0 0 0 0  Control/stop worrying 0 0 0 0  Worry too much - different things 0 0 0 1  Trouble relaxing 0 0 0 0  Restless 0 0 0 0  Easily annoyed or irritable 0 0 0 0  Afraid - awful might happen 0 0 0 0  Total GAD 7 Score 0 0 0 1  Anxiety Difficulty Not difficult at all Not difficult at all Not difficult at all Not difficult at all       09/30/2022    9:40 AM 08/26/2022    9:40 AM 06/05/2022   10:51 AM  Depression screen PHQ 2/9  Decreased Interest 0 0 0  Down, Depressed, Hopeless 0 0 0  PHQ - 2 Score 0 0 0  Altered sleeping 0 2 0  Tired, decreased energy 0 0 0  Change in appetite 0 0 0  Feeling bad or failure about yourself  0 0 0  Trouble concentrating 0 0 0  Moving slowly or fidgety/restless 0 0 0  Suicidal thoughts 0 0 0  PHQ-9 Score 0 2 0  Difficult doing work/chores Not difficult at all Not difficult at all Not difficult at all    BP Readings from Last 3 Encounters:  12/30/22 124/79  09/30/22 128/80   08/26/22 (!) 122/90    Physical Exam Vitals and nursing note reviewed.  Constitutional:      General: She is not in acute distress.    Appearance: She is well-developed.  HENT:     Head: Normocephalic and atraumatic.  Neck:     Vascular: No carotid bruit.  Cardiovascular:     Rate and Rhythm: Normal rate and regular rhythm.     Pulses: Normal pulses.     Heart  sounds: No murmur heard. Pulmonary:     Effort: Pulmonary effort is normal. No respiratory distress.     Breath sounds: No wheezing or rhonchi.  Musculoskeletal:        General: Normal range of motion.     Cervical back: Normal range of motion.     Right lower leg: No edema.     Left lower leg: No edema.  Lymphadenopathy:     Cervical: No cervical adenopathy.  Skin:    General: Skin is warm and dry.     Capillary Refill: Capillary refill takes less than 2 seconds.     Findings: No rash.  Neurological:     General: No focal deficit present.     Mental Status: She is alert and oriented to person, place, and time.  Psychiatric:        Mood and Affect: Mood normal.        Behavior: Behavior normal.     Wt Readings from Last 3 Encounters:  12/30/22 200 lb 3.2 oz (90.8 kg)  09/30/22 213 lb (96.6 kg)  08/26/22 212 lb 9.6 oz (96.4 kg)    BP 124/79   Pulse 86   Ht 5' 4.5" (1.638 m)   Wt 200 lb 3.2 oz (90.8 kg)   SpO2 99%   BMI 33.83 kg/m   Assessment and Plan:  Problem List Items Addressed This Visit     Type II diabetes mellitus with complication (HCC) - Primary (Chronic)    Blood sugars stable without hypoglycemic symptoms or events. Currently being treated with metformin and Ozempic. She has lost 13 lbs since her last visit. Lab Results  Component Value Date   HGBA1C 6.4 (A) 08/26/2022  A1C today is 5.8       Relevant Orders   POCT glycosylated hemoglobin (Hb A1C) (Completed)   Primary hyperparathyroidism (HCC) (Chronic)    Recently seen by Endocrinology Plan for watchful waiting - has follow  up in October      Benign hypertension (Chronic)    Stable exam with well controlled BP.  Currently taking enalapril and spironolactone. Tolerating medications without concerns or side effects. Will continue to recommend low sodium diet and current regimen.       Other Visit Diagnoses     Long term current use of oral hypoglycemic drug       Need for vaccination for pneumococcus       Relevant Orders   Pneumococcal conjugate vaccine 20-valent (Completed)       No follow-ups on file.   Partially dictated using Dragon software, any errors are not intentional.  Reubin Milan, MD [facility] [place], [place]

## 2023-01-11 ENCOUNTER — Other Ambulatory Visit: Payer: Self-pay | Admitting: Internal Medicine

## 2023-01-11 DIAGNOSIS — I10 Essential (primary) hypertension: Secondary | ICD-10-CM

## 2023-01-11 DIAGNOSIS — E118 Type 2 diabetes mellitus with unspecified complications: Secondary | ICD-10-CM

## 2023-01-11 DIAGNOSIS — E1169 Type 2 diabetes mellitus with other specified complication: Secondary | ICD-10-CM

## 2023-01-12 ENCOUNTER — Other Ambulatory Visit: Payer: Self-pay | Admitting: Internal Medicine

## 2023-01-12 DIAGNOSIS — M25561 Pain in right knee: Secondary | ICD-10-CM

## 2023-01-12 DIAGNOSIS — I10 Essential (primary) hypertension: Secondary | ICD-10-CM

## 2023-01-12 NOTE — Telephone Encounter (Signed)
All medications except Meloxicam have been requested from the pharmacy on 01/11/23 in a refill encounter, pending approval.

## 2023-01-12 NOTE — Telephone Encounter (Signed)
Medication Refill - Medication: spironolactone (ALDACTONE) 25 MG table rosuvastatin (CRESTOR) 10 MG tablet  metFORMIN (GLUCOPHAGE-XR) 500 MG 24 hr tablet meloxicam (MOBIC) 15 MG tablet enalapril (VASOTEC) 10 MG table  Pt states that she is out of medication.   Has the patient contacted their pharmacy? Yes.   Pt has changed to mail order pharmacy and they advised her to reach out to her PCP. They need permission to refill the prescriptions. Per the pharmacy they have been trying to get in touch with pt PCP for the refill request.   Preferred Pharmacy (with phone number or street name): [facility] - [place], [place] - [address]  Phone: [phone] Fax: [phone]  Has the patient been seen for an appointment in the last year OR does the patient have an upcoming appointment? Yes.    Agent: Please be advised that RX refills may take up to 3 business days. We ask that you follow-up with your pharmacy.

## 2023-01-13 NOTE — Telephone Encounter (Signed)
Requested too soon . Last refill 12/02/22 #90 .  Requested Prescriptions  Refused Prescriptions Disp Refills   meloxicam (MOBIC) 15 MG tablet 90 tablet 0    Sig: Take 1 tablet (15 mg total) by mouth daily.     Analgesics:  COX2 Inhibitors Failed - 01/12/2023 11:23 AM      Failed - Manual Review: Labs are only required if the patient has taken medication for more than 8 weeks.      Passed - HGB in normal range and within 360 days    Hemoglobin  Date Value Ref Range Status  04/24/2022 12.9 11.1 - 15.9 g/dL Final         Passed - Cr in normal range and within 360 days    Creatinine, Ser  Date Value Ref Range Status  04/24/2022 0.67 0.57 - 1.00 mg/dL Final         Passed - HCT in normal range and within 360 days    Hematocrit  Date Value Ref Range Status  04/24/2022 38.7 34.0 - 46.6 % Final         Passed - AST in normal range and within 360 days    AST  Date Value Ref Range Status  04/24/2022 16 0 - 40 IU/L Final         Passed - ALT in normal range and within 360 days    ALT  Date Value Ref Range Status  04/24/2022 12 0 - 32 IU/L Final         Passed - eGFR is 30 or above and within 360 days    GFR calc Af Amer  Date Value Ref Range Status  04/16/2020 108 >59 mL/min/1.73 Final    Comment:    **In accordance with recommendations from the NKF-ASN Task force,**   [facility] is in the process of updating its eGFR calculation to the   2021 CKD-EPI creatinine equation that estimates kidney function   without a race variable.    GFR calc non Af Amer  Date Value Ref Range Status  04/16/2020 94 >59 mL/min/1.73 Final   eGFR  Date Value Ref Range Status  04/24/2022 98 >59 mL/min/1.73 Final         Passed - Patient is not pregnant      Passed - Valid encounter within last 12 months    Recent Outpatient Visits           2 weeks ago Type II diabetes mellitus with complication [facility])   [place] [facility] at [facility], Nyoka Cowden, MD   3  months ago Acute pain of right knee   [place] [facility] at [facility], Nyoka Cowden, MD   4 months ago Type II diabetes mellitus with complication [facility])   [place] [facility] at [facility], Nyoka Cowden, MD   7 months ago Acute non-recurrent maxillary sinusitis   [place] [facility] at [facility], Nyoka Cowden, MD   8 months ago Annual physical exam   [facility] at [facility], Nyoka Cowden, MD       Future Appointments             In 3 months Judithann Graves, Nyoka Cowden, MD [facility] at [facility], [facility]

## 2023-01-26 ENCOUNTER — Other Ambulatory Visit: Payer: Self-pay | Admitting: Internal Medicine

## 2023-01-26 DIAGNOSIS — E118 Type 2 diabetes mellitus with unspecified complications: Secondary | ICD-10-CM

## 2023-02-02 ENCOUNTER — Other Ambulatory Visit: Payer: Self-pay | Admitting: Internal Medicine

## 2023-02-02 DIAGNOSIS — M25561 Pain in right knee: Secondary | ICD-10-CM

## 2023-04-29 ENCOUNTER — Ambulatory Visit (INDEPENDENT_AMBULATORY_CARE_PROVIDER_SITE_OTHER): Payer: Medicare Other | Admitting: Internal Medicine

## 2023-04-29 ENCOUNTER — Encounter: Payer: Self-pay | Admitting: Internal Medicine

## 2023-04-29 VITALS — BP 122/78 | HR 78 | Ht 64.5 in | Wt 198.0 lb

## 2023-04-29 DIAGNOSIS — Z1231 Encounter for screening mammogram for malignant neoplasm of breast: Secondary | ICD-10-CM | POA: Diagnosis not present

## 2023-04-29 DIAGNOSIS — E118 Type 2 diabetes mellitus with unspecified complications: Secondary | ICD-10-CM | POA: Diagnosis not present

## 2023-04-29 DIAGNOSIS — E1169 Type 2 diabetes mellitus with other specified complication: Secondary | ICD-10-CM

## 2023-04-29 DIAGNOSIS — Z1211 Encounter for screening for malignant neoplasm of colon: Secondary | ICD-10-CM

## 2023-04-29 DIAGNOSIS — Z7984 Long term (current) use of oral hypoglycemic drugs: Secondary | ICD-10-CM

## 2023-04-29 DIAGNOSIS — Z23 Encounter for immunization: Secondary | ICD-10-CM

## 2023-04-29 DIAGNOSIS — I10 Essential (primary) hypertension: Secondary | ICD-10-CM

## 2023-04-29 DIAGNOSIS — E785 Hyperlipidemia, unspecified: Secondary | ICD-10-CM

## 2023-04-29 MED ORDER — OZEMPIC (1 MG/DOSE) 4 MG/3ML ~~LOC~~ SOPN
1.0000 mg | PEN_INJECTOR | SUBCUTANEOUS | 5 refills | Status: DC
Start: 2023-04-29 — End: 2023-09-21

## 2023-04-29 NOTE — Assessment & Plan Note (Addendum)
Blood sugars stable without hypoglycemic symptoms or events. Current regimen is Ozempic and metformin. Changes made last visit are none.  If A1C is lower, will consider stopping Ozempic or dosing every 10-14 days. Lab Results  Component Value Date   HGBA1C 5.8 (A) 12/30/2022

## 2023-04-29 NOTE — Patient Instructions (Signed)
Call [facility] to schedule your mammogram at [phone].  

## 2023-04-29 NOTE — Assessment & Plan Note (Signed)
LDL is  Lab Results  Component Value Date   LDLCALC 100 (H) 04/24/2022  Currently being treated with Crestor with good compliance and no concerns.

## 2023-04-29 NOTE — Assessment & Plan Note (Signed)
Normal exam with stable BP on enalapril and Aldactone. No concerns or side effects to current medication. No change in regimen; continue low sodium diet.

## 2023-04-29 NOTE — Progress Notes (Addendum)
Date:  04/29/2023   Name:  Molly Kelly   DOB:  [birth year]   MRN:  [id number]   Chief Complaint: Annual Exam Molly Kelly is a 67 y.o. female who presents today for her  initial medicare wellness Exam.   She feels well. She reports exercising at the gym three times per week.  She reports she is sleeping poorly. Breast complaints - none.  Mammogram: 06/2022 DEXA:  06/2022 Normal  Colonoscopy: 2019 repeat 5 yrs   Health Maintenance Due  Topic Date Due   HIV Screening  Never done   OPHTHALMOLOGY EXAM  02/20/2023   COVID-19 Vaccine (4 - 2023-24 season) 03/01/2023   Diabetic kidney evaluation - eGFR measurement  04/25/2023   Diabetic kidney evaluation - Urine ACR  04/25/2023   Colonoscopy  04/30/2023    Immunization History  Administered Date(s) Administered   Fluad Trivalent(High Dose 65+) 04/29/2023   Influenza Inj Mdck Quad Pf 04/03/2017   Influenza,inj,Quad PF,6+ Mos 04/13/2018, 04/16/2020, 03/13/2021, 04/24/2022   Influenza-Unspecified 06/29/2016, 04/28/2019   PFIZER(Purple Top)SARS-COV-2 Vaccination 08/23/2019, 09/13/2019, 05/04/2020   PNEUMOCOCCAL CONJUGATE-20 12/30/2022   Pneumococcal Polysaccharide-23 09/18/2020   Tdap 09/18/2011, 04/24/2022   Zoster Recombinant(Shingrix) 03/13/2021, 08/12/2021     Diabetes She presents for her follow-up diabetic visit. She has type 2 diabetes mellitus. Pertinent negatives for hypoglycemia include no dizziness, headaches, nervousness/anxiousness or tremors. Pertinent negatives for diabetes include no chest pain, no fatigue, no polydipsia and no polyuria.  Hypertension This is a chronic problem. The problem is controlled. Pertinent negatives include no chest pain, headaches, palpitations or shortness of breath.    Review of Systems  Constitutional:  Negative for chills, fatigue and fever.  HENT:  Negative for congestion, hearing loss, tinnitus, trouble swallowing and voice change.   Eyes:  Negative for visual disturbance.   Respiratory:  Negative for cough, chest tightness, shortness of breath and wheezing.   Cardiovascular:  Negative for chest pain, palpitations and leg swelling.  Gastrointestinal:  Negative for abdominal pain, constipation, diarrhea and vomiting.  Endocrine: Negative for polydipsia and polyuria.  Genitourinary:  Negative for dysuria, frequency, genital sores, vaginal bleeding and vaginal discharge.  Musculoskeletal:  Positive for gait problem. Negative for joint swelling. Arthralgias: knee improved.. Skin:  Negative for color change and rash.  Neurological:  Negative for dizziness, tremors, light-headedness and headaches.  Hematological:  Negative for adenopathy. Does not bruise/bleed easily.  Psychiatric/Behavioral:  Negative for dysphoric mood and sleep disturbance. The patient is not nervous/anxious.      Lab Results  Component Value Date   NA 141 04/24/2022   K 4.4 04/24/2022   CO2 23 04/24/2022   GLUCOSE 95 04/24/2022   BUN 8 04/24/2022   CREATININE 0.67 04/24/2022   CALCIUM 11.0 (H) 04/24/2022   EGFR 98 04/24/2022   GFRNONAA 94 04/16/2020   Lab Results  Component Value Date   CHOL 154 04/24/2022   HDL 40 04/24/2022   LDLCALC 100 (H) 04/24/2022   TRIG 71 04/24/2022   CHOLHDL 3.9 04/24/2022   Lab Results  Component Value Date   TSH 1.510 04/24/2022   Lab Results  Component Value Date   HGBA1C 5.8 (A) 12/30/2022   Lab Results  Component Value Date   WBC 4.3 04/24/2022   HGB 12.9 04/24/2022   HCT 38.7 04/24/2022   MCV 92 04/24/2022   PLT 283 04/24/2022   Lab Results  Component Value Date   ALT 12 04/24/2022   AST 16 04/24/2022   ALKPHOS 66  04/24/2022   BILITOT 0.5 04/24/2022   No results found for: "25OHVITD2", "25OHVITD3", "VD25OH"   Patient Active Problem List   Diagnosis Date Noted   Retained intrauterine contraceptive device (IUD) 08/12/2021   Primary hyperparathyroidism (HCC) 08/12/2021   Type II diabetes mellitus with complication (HCC) 04/20/2019    Hyperlipidemia associated with type 2 diabetes mellitus (HCC) 08/11/2018   Benign neoplasm of transverse colon    Tubular adenoma of colon    Coronary artery disease involving native heart 05/02/2015   Gastroesophageal reflux disease 05/02/2015   Benign hypertension 05/02/2015    No Known Allergies  Past Surgical History:  Procedure Laterality Date   CARPAL TUNNEL RELEASE Bilateral    right - 14 yrs ago.  left - 06/2017   COLONOSCOPY WITH PROPOFOL N/A 04/29/2018   Procedure: COLONOSCOPY WITH BIOPSY;  Surgeon: Midge Minium, MD;  Location: [facility];  Service: Endoscopy;  Laterality: N/A;   POLYPECTOMY N/A 04/29/2018   Procedure: POLYPECTOMY;  Surgeon: Midge Minium, MD;  Location: [facility];  Service: Endoscopy;  Laterality: N/A;    Social History   Tobacco Use   Smoking status: Never   Smokeless tobacco: Never  Vaping Use   Vaping status: Never Used  Substance Use Topics   Alcohol use: Yes    Comment: socially   Drug use: No     Medication list has been reviewed and updated.  Current Meds  Medication Sig   aspirin 81 MG tablet Take 1 tablet by mouth daily.   enalapril (VASOTEC) 10 MG tablet TAKE 1 TABLET BY MOUTH DAILY   Insulin Pen Needle (PEN NEEDLES) 32G X 5 MM MISC 1 each by Does not apply route once a week.   meloxicam (MOBIC) 15 MG tablet TAKE 1 TABLET BY MOUTH DAILY   metFORMIN (GLUCOPHAGE-XR) 500 MG 24 hr tablet TAKE 1 TABLET BY MOUTH DAILY  WITH BREAKFAST   Multiple Vitamins-Minerals (ZINC PO) Take 1 tablet by mouth daily.   rosuvastatin (CRESTOR) 10 MG tablet TAKE 1 TABLET BY MOUTH DAILY   spironolactone (ALDACTONE) 25 MG tablet TAKE 1 TABLET BY MOUTH DAILY   VITAMIN D PO Take 1 tablet by mouth daily.   zinc gluconate 50 MG tablet Take 50 mg by mouth daily.   [DISCONTINUED] Semaglutide, 1 MG/DOSE, (OZEMPIC, 1 MG/DOSE,) 4 MG/3ML SOPN INJECT 1 MG ONCE A WEEK AS DIRECTED       04/29/2023    8:57 AM 09/30/2022    9:40 AM 08/26/2022     9:40 AM 06/05/2022   10:51 AM  GAD 7 : Generalized Anxiety Score  Nervous, Anxious, on Edge 0 0 0 0  Control/stop worrying 0 0 0 0  Worry too much - different things 0 0 0 0  Trouble relaxing 0 0 0 0  Restless 0 0 0 0  Easily annoyed or irritable 0 0 0 0  Afraid - awful might happen 0 0 0 0  Total GAD 7 Score 0 0 0 0  Anxiety Difficulty Not difficult at all Not difficult at all Not difficult at all Not difficult at all       04/29/2023    8:57 AM 09/30/2022    9:40 AM 08/26/2022    9:40 AM  Depression screen PHQ 2/9  Decreased Interest 0 0 0  Down, Depressed, Hopeless 0 0 0  PHQ - 2 Score 0 0 0  Altered sleeping 1 0 2  Tired, decreased energy 0 0 0  Change in appetite 0 0  0  Feeling bad or failure about yourself  0 0 0  Trouble concentrating 0 0 0  Moving slowly or fidgety/restless 0 0 0  Suicidal thoughts 0 0 0  PHQ-9 Score 1 0 2  Difficult doing work/chores Not difficult at all Not difficult at all Not difficult at all   SDOH Screenings   Food Insecurity: No Food Insecurity (04/29/2023)  Housing: Low Risk  (04/29/2023)  Transportation Needs: No Transportation Needs (09/11/2022)   [facility]  Utilities: Not At Risk (04/29/2023)  Alcohol Screen: Low Risk  (04/29/2023)  Depression (PHQ2-9): Low Risk  (04/29/2023)  Financial Resource Strain: Low Risk  (04/29/2023)  Physical Activity: Sufficiently Active (04/29/2023)  Stress: No Stress Concern Present (04/29/2023)  Tobacco Use: Low Risk  (04/29/2023)  Health Literacy: Adequate Health Literacy (04/29/2023)      04/29/2023    9:07 AM  6CIT Screen  What Year? 0 points  What month? 0 points  What time? 0 points  Count back from 20 0 points  Months in reverse 0 points  Repeat phrase 4 points  Total Score 4 points   Functional Status Survey: Is the patient deaf or have difficulty hearing?: No Does the patient have difficulty seeing, even when wearing  glasses/contacts?: No Does the patient have difficulty concentrating, remembering, or making decisions?: No Does the patient have difficulty walking or climbing stairs?: No Does the patient have difficulty dressing or bathing?: No Does the patient have difficulty doing errands alone such as visiting a doctor's office or shopping?: No   Medicare Risk at Home - 04/29/23 0909     Any stairs in or around the home? Yes    If so, are there any without handrails? No    Home free of loose throw rugs in walkways, pet beds, electrical cords, etc? Yes    Adequate lighting in your home to reduce risk of falls? Yes    Life alert? No    Use of a cane, walker or w/c? No    Grab bars in the bathroom? No    Shower chair or bench in shower? No    Elevated toilet seat or a handicapped toilet? No               Hearing Screening - Comments:: No concerns Vision Screening - Comments:: No concerns.   BP Readings from Last 3 Encounters:  04/29/23 122/78  12/30/22 124/79  09/30/22 128/80    Physical Exam Vitals and nursing note reviewed.  Constitutional:      General: She is not in acute distress.    Appearance: She is well-developed.  HENT:     Head: Normocephalic and atraumatic.     Right Ear: Tympanic membrane and ear canal normal.     Left Ear: Tympanic membrane and ear canal normal.     Nose:     Right Sinus: No maxillary sinus tenderness.     Left Sinus: No maxillary sinus tenderness.  Eyes:     General: No scleral icterus.       Right eye: No discharge.        Left eye: No discharge.     Conjunctiva/sclera: Conjunctivae normal.  Neck:     Thyroid: No thyromegaly.     Vascular: No carotid bruit.  Cardiovascular:     Rate and Rhythm: Normal rate and regular rhythm.     Pulses: Normal pulses.     Heart sounds: Normal heart sounds.  Pulmonary:     Effort: Pulmonary effort is normal. No respiratory distress.     Breath sounds: No wheezing.  Chest:  Breasts:    Right: No mass,  nipple discharge, skin change or tenderness.     Left: No mass, nipple discharge, skin change or tenderness.  Abdominal:     General: Bowel sounds are normal.     Palpations: Abdomen is soft.     Tenderness: There is no abdominal tenderness.  Musculoskeletal:     Cervical back: Normal range of motion. No erythema.     Right lower leg: No edema.     Left lower leg: No edema.  Lymphadenopathy:     Cervical: No cervical adenopathy.  Skin:    General: Skin is warm and dry.     Findings: No rash.  Neurological:     Mental Status: She is alert and oriented to person, place, and time.     Cranial Nerves: No cranial nerve deficit.     Sensory: No sensory deficit.     Deep Tendon Reflexes: Reflexes are normal and symmetric.  Psychiatric:        Attention and Perception: Attention normal.        Mood and Affect: Mood normal.    Diabetic Foot Exam - Simple   Simple Foot Form Diabetic Foot exam was performed with the following findings: Yes 04/29/2023  9:21 AM  Visual Inspection No deformities, no ulcerations, no other skin breakdown bilaterally: Yes Sensation Testing Intact to touch and monofilament testing bilaterally: Yes Pulse Check Posterior Tibialis and Dorsalis pulse intact bilaterally: Yes Comments      Wt Readings from Last 3 Encounters:  04/29/23 198 lb (89.8 kg)  12/30/22 200 lb 3.2 oz (90.8 kg)  09/30/22 213 lb (96.6 kg)    BP 122/78   Pulse 78   Ht 5' 4.5" (1.638 m)   Wt 198 lb (89.8 kg)   SpO2 100%   BMI 33.46 kg/m   Assessment and Plan:  Problem List Items Addressed This Visit       Unprioritized   Benign hypertension (Chronic)    Normal exam with stable BP on enalapril and Aldactone. No concerns or side effects to current medication. No change in regimen; continue low sodium diet.       Relevant Orders   CBC with Differential/Platelet   Comprehensive metabolic panel   Hyperlipidemia associated with type 2 diabetes mellitus (HCC) (Chronic)     LDL is  Lab Results  Component Value Date   LDLCALC 100 (H) 04/24/2022  Currently being treated with Crestor with good compliance and no concerns.       Relevant Medications   Semaglutide, 1 MG/DOSE, (OZEMPIC, 1 MG/DOSE,) 4 MG/3ML SOPN   Other Relevant Orders   Comprehensive metabolic panel   Lipid panel   Type II diabetes mellitus with complication (HCC) - Primary (Chronic)    Blood sugars stable without hypoglycemic symptoms or events. Current regimen is Ozempic and metformin. Changes made last visit are none.  If A1C is lower, will consider stopping Ozempic or dosing every 10-14 days. Lab Results  Component Value Date   HGBA1C 5.8 (A) 12/30/2022         Relevant Medications   Semaglutide, 1 MG/DOSE, (OZEMPIC, 1 MG/DOSE,) 4 MG/3ML SOPN   Other Relevant Orders   Hemoglobin A1c   TSH   Microalbumin / creatinine urine ratio   Other Visit Diagnoses     Encounter for screening mammogram for breast cancer  Relevant Orders   MM 3D SCREENING MAMMOGRAM BILATERAL BREAST   Colon cancer screening       Relevant Orders   Ambulatory referral to Gastroenterology   Need for influenza vaccination       Relevant Orders   Flu Vaccine Trivalent High Dose (Fluad) (Completed)   Long term current use of oral hypoglycemic drug           Return in about 4 months (around 08/28/2023) for DM, HTN.    Reubin Milan, MD [facility] [place]

## 2023-04-30 LAB — CBC WITH DIFFERENTIAL/PLATELET
Basophils Absolute: 0 10*3/uL (ref 0.0–0.2)
Basos: 1 %
EOS (ABSOLUTE): 0.1 10*3/uL (ref 0.0–0.4)
Eos: 1 %
Hematocrit: 40.6 % (ref 34.0–46.6)
Hemoglobin: 13.3 g/dL (ref 11.1–15.9)
Immature Grans (Abs): 0 10*3/uL (ref 0.0–0.1)
Immature Granulocytes: 0 %
Lymphocytes Absolute: 2.1 10*3/uL (ref 0.7–3.1)
Lymphs: 41 %
MCH: 31.2 pg (ref 26.6–33.0)
MCHC: 32.8 g/dL (ref 31.5–35.7)
MCV: 95 fL (ref 79–97)
Monocytes Absolute: 0.4 10*3/uL (ref 0.1–0.9)
Monocytes: 8 %
Neutrophils Absolute: 2.5 10*3/uL (ref 1.4–7.0)
Neutrophils: 49 %
Platelets: 290 10*3/uL (ref 150–450)
RBC: 4.26 x10E6/uL (ref 3.77–5.28)
RDW: 12.5 % (ref 11.7–15.4)
WBC: 5 10*3/uL (ref 3.4–10.8)

## 2023-04-30 LAB — LIPID PANEL
Chol/HDL Ratio: 3.6 ratio (ref 0.0–4.4)
Cholesterol, Total: 150 mg/dL (ref 100–199)
HDL: 42 mg/dL (ref >39)
LDL Chol Calc (NIH): 97 mg/dL (ref 0–99)
Triglycerides: 52 mg/dL (ref 0–149)
VLDL Cholesterol Cal: 11 mg/dL (ref 5–40)

## 2023-04-30 LAB — MICROALBUMIN / CREATININE URINE RATIO
Creatinine, Urine: 168.4 mg/dL
Microalb/Creat Ratio: 19 mg/g{creat} (ref 0–29)
Microalbumin, Urine: 31.8 ug/mL

## 2023-04-30 LAB — COMPREHENSIVE METABOLIC PANEL
ALT: 14 [IU]/L (ref 0–32)
AST: 14 [IU]/L (ref 0–40)
Albumin: 4.5 g/dL (ref 3.9–4.9)
Alkaline Phosphatase: 79 [IU]/L (ref 44–121)
BUN/Creatinine Ratio: 15 (ref 12–28)
BUN: 10 mg/dL (ref 8–27)
Bilirubin Total: 0.4 mg/dL (ref 0.0–1.2)
CO2: 22 mmol/L (ref 20–29)
Calcium: 10.6 mg/dL — ABNORMAL HIGH (ref 8.7–10.3)
Chloride: 103 mmol/L (ref 96–106)
Creatinine, Ser: 0.66 mg/dL (ref 0.57–1.00)
Globulin, Total: 2.6 g/dL (ref 1.5–4.5)
Glucose: 89 mg/dL (ref 70–99)
Potassium: 4.2 mmol/L (ref 3.5–5.2)
Sodium: 140 mmol/L (ref 134–144)
Total Protein: 7.1 g/dL (ref 6.0–8.5)
eGFR: 97 mL/min/{1.73_m2} (ref >59)

## 2023-04-30 LAB — TSH: TSH: 1.12 u[IU]/mL (ref 0.450–4.500)

## 2023-04-30 LAB — HEMOGLOBIN A1C
Est. average glucose Bld gHb Est-mCnc: 126 mg/dL
Hgb A1c MFr Bld: 6 % — ABNORMAL HIGH (ref 4.8–5.6)

## 2023-05-06 ENCOUNTER — Other Ambulatory Visit: Payer: Self-pay | Admitting: *Deleted

## 2023-05-06 ENCOUNTER — Telehealth: Payer: Self-pay | Admitting: *Deleted

## 2023-05-06 DIAGNOSIS — Z860100 Personal history of colon polyps, unspecified: Secondary | ICD-10-CM

## 2023-05-06 MED ORDER — NA SULFATE-K SULFATE-MG SULF 17.5-3.13-1.6 GM/177ML PO SOLN: 1.0000 | Freq: Once | ORAL | 0 refills | Status: AC

## 2023-05-06 NOTE — Telephone Encounter (Signed)
Gastroenterology Pre-Procedure Review  Request Date: 06/19/2023 Requesting Physician: Dr. Servando Snare  PATIENT REVIEW QUESTIONS: The patient responded to the following health history questions as indicated:    1. Are you having any GI issues? no 2. Do you have a personal history of Polyps? yes (04/29/2018) 3. Do you have a family history of Colon Cancer or Polyps? no 4. Diabetes Mellitus? yes (metformin, semaglutide) 5. Joint replacements in the past 12 months?no 6. Major health problems in the past 3 months?no 7. Any artificial heart valves, MVP, or defibrillator?no    MEDICATIONS & ALLERGIES:    Patient reports the following regarding taking any anticoagulation/antiplatelet therapy:   Plavix, Coumadin, Eliquis, Xarelto, Lovenox, Pradaxa, Brilinta, or Effient? no Aspirin? yes (81 mg)  Patient confirms/reports the following medications:  Current Outpatient Medications  Medication Sig Dispense Refill   aspirin 81 MG tablet Take 1 tablet by mouth daily.     enalapril (VASOTEC) 10 MG tablet TAKE 1 TABLET BY MOUTH DAILY 90 tablet 3   Insulin Pen Needle (PEN NEEDLES) 32G X 5 MM MISC 1 each by Does not apply route once a week. 50 each 0   meloxicam (MOBIC) 15 MG tablet TAKE 1 TABLET BY MOUTH DAILY 90 tablet 3   metFORMIN (GLUCOPHAGE-XR) 500 MG 24 hr tablet TAKE 1 TABLET BY MOUTH DAILY  WITH BREAKFAST 90 tablet 3   Multiple Vitamins-Minerals (ZINC PO) Take 1 tablet by mouth daily.     rosuvastatin (CRESTOR) 10 MG tablet TAKE 1 TABLET BY MOUTH DAILY 90 tablet 3   Semaglutide, 1 MG/DOSE, (OZEMPIC, 1 MG/DOSE,) 4 MG/3ML SOPN Inject 1 mg into the skin once a week. 3 mL 5   spironolactone (ALDACTONE) 25 MG tablet TAKE 1 TABLET BY MOUTH DAILY 90 tablet 3   VITAMIN D PO Take 1 tablet by mouth daily.     zinc gluconate 50 MG tablet Take 50 mg by mouth daily.     No current facility-administered medications for this visit.    Patient confirms/reports the following allergies:  No Known Allergies  No  orders of the defined types were placed in this encounter.   AUTHORIZATION INFORMATION Primary Insurance: 1D#: Group #:  Secondary Insurance: 1D#: Group #:  SCHEDULE INFORMATION: Date: 06/19/2023 Time: Location:  [facility]

## 2023-06-03 LAB — HM DIABETES EYE EXAM

## 2023-06-09 ENCOUNTER — Telehealth: Payer: Self-pay | Admitting: *Deleted

## 2023-06-09 NOTE — Telephone Encounter (Signed)
Per Selena Batten at [facility]  This patient scheduled for 12-20 and is moving to 1-10  New instructions will be sent for patient

## 2023-06-29 ENCOUNTER — Encounter: Payer: Self-pay | Admitting: Anesthesiology

## 2023-06-29 ENCOUNTER — Encounter: Payer: Self-pay | Admitting: Gastroenterology

## 2023-07-08 ENCOUNTER — Ambulatory Visit
Admission: RE | Admit: 2023-07-08 | Discharge: 2023-07-08 | Disposition: A | Discharge status: Home / Self Care | Payer: Medicare Other | Source: Ambulatory Visit | Attending: Internal Medicine | Admitting: Internal Medicine

## 2023-07-08 DIAGNOSIS — Z1231 Encounter for screening mammogram for malignant neoplasm of breast: Secondary | ICD-10-CM | POA: Diagnosis present

## 2023-07-09 ENCOUNTER — Encounter: Payer: Self-pay | Admitting: Gastroenterology

## 2023-07-14 ENCOUNTER — Telehealth: Payer: Self-pay

## 2023-07-14 NOTE — Telephone Encounter (Signed)
 Patient states her aunt passed away this morning and she is not going to be able to have her colonoscopy done on 07/17/2023. She states she will call back when she is ready to reschedule to procedure.

## 2023-07-14 NOTE — Telephone Encounter (Signed)
 I have let notified Molly Kelly at [facility] about the cancellation. Patient's aunt passed away this morning.  She will call back when she is ready.

## 2023-07-17 ENCOUNTER — Ambulatory Visit: Admit: 2023-07-17 | Payer: Medicare Other | Admitting: Gastroenterology

## 2023-07-17 HISTORY — DX: Type 2 diabetes mellitus with unspecified complications: E11.8

## 2023-07-17 SURGERY — COLONOSCOPY WITH PROPOFOL
Anesthesia: Choice

## 2023-08-12 ENCOUNTER — Ambulatory Visit: Payer: Self-pay | Admitting: Internal Medicine

## 2023-09-08 ENCOUNTER — Ambulatory Visit: Payer: Medicare Other | Admitting: Internal Medicine

## 2023-09-21 ENCOUNTER — Other Ambulatory Visit: Payer: Self-pay | Admitting: Internal Medicine

## 2023-09-21 ENCOUNTER — Encounter: Payer: Self-pay | Admitting: Internal Medicine

## 2023-09-21 ENCOUNTER — Ambulatory Visit (INDEPENDENT_AMBULATORY_CARE_PROVIDER_SITE_OTHER): Admitting: Internal Medicine

## 2023-09-21 ENCOUNTER — Ambulatory Visit: Admitting: Internal Medicine

## 2023-09-21 VITALS — BP 132/86 | HR 83 | Ht 65.5 in | Wt 191.4 lb

## 2023-09-21 DIAGNOSIS — I10 Essential (primary) hypertension: Secondary | ICD-10-CM | POA: Diagnosis not present

## 2023-09-21 DIAGNOSIS — E1169 Type 2 diabetes mellitus with other specified complication: Secondary | ICD-10-CM | POA: Diagnosis not present

## 2023-09-21 DIAGNOSIS — E785 Hyperlipidemia, unspecified: Secondary | ICD-10-CM

## 2023-09-21 DIAGNOSIS — E118 Type 2 diabetes mellitus with unspecified complications: Secondary | ICD-10-CM | POA: Diagnosis not present

## 2023-09-21 DIAGNOSIS — Z7985 Long-term (current) use of injectable non-insulin antidiabetic drugs: Secondary | ICD-10-CM

## 2023-09-21 LAB — POCT GLYCOSYLATED HEMOGLOBIN (HGB A1C): Hemoglobin A1C: 6 % — AB (ref 4.0–5.6)

## 2023-09-21 MED ORDER — OZEMPIC (1 MG/DOSE) 4 MG/3ML ~~LOC~~ SOPN: 1.0000 mg | PEN_INJECTOR | SUBCUTANEOUS | 5 refills | Status: DC

## 2023-09-21 NOTE — Assessment & Plan Note (Signed)
 LDL is  Lab Results  Component Value Date   LDLCALC 97 04/29/2023   Current regimen is Crestor.  No medication side effects noted. Goal LDL is <70.

## 2023-09-21 NOTE — Progress Notes (Signed)
 Date:  09/21/2023   Name:  Molly Kelly   DOB:  29-Nov-1955   MRN:  [id number]   Chief Complaint: Diabetes, Hypertension, and Gastroesophageal Reflux (Patient said she has been experiencing some acid reflux lately)  Diabetes She presents for her follow-up diabetic visit. She has type 2 diabetes mellitus. Pertinent negatives for hypoglycemia include no dizziness or headaches. Pertinent negatives for diabetes include no chest pain, no fatigue and no weakness.  Hypertension This is a chronic problem. The problem is controlled. Pertinent negatives include no chest pain, headaches, palpitations or shortness of breath.  Gastroesophageal Reflux She reports no abdominal pain, no chest pain, no coughing or no wheezing. Pertinent negatives include no fatigue.    Review of Systems  Constitutional:  Positive for unexpected weight change (losing weight on Ozempic). Negative for chills and fatigue.  HENT:  Negative for trouble swallowing.   Eyes:  Negative for visual disturbance.  Respiratory:  Negative for cough, chest tightness, shortness of breath and wheezing.   Cardiovascular:  Negative for chest pain, palpitations and leg swelling.  Gastrointestinal:  Negative for abdominal pain, constipation and diarrhea.  Musculoskeletal:  Negative for arthralgias and myalgias.  Neurological:  Negative for dizziness, weakness, light-headedness and headaches.     Lab Results  Component Value Date   NA 140 04/29/2023   K 4.2 04/29/2023   CO2 22 04/29/2023   GLUCOSE 89 04/29/2023   BUN 10 04/29/2023   CREATININE 0.66 04/29/2023   CALCIUM 10.6 (H) 04/29/2023   EGFR 97 04/29/2023   GFRNONAA 94 04/16/2020   Lab Results  Component Value Date   CHOL 150 04/29/2023   HDL 42 04/29/2023   LDLCALC 97 04/29/2023   TRIG 52 04/29/2023   CHOLHDL 3.6 04/29/2023   Lab Results  Component Value Date   TSH 1.120 04/29/2023   Lab Results  Component Value Date   HGBA1C 6.0 (A) 09/21/2023   Lab Results   Component Value Date   WBC 5.0 04/29/2023   HGB 13.3 04/29/2023   HCT 40.6 04/29/2023   MCV 95 04/29/2023   PLT 290 04/29/2023   Lab Results  Component Value Date   ALT 14 04/29/2023   AST 14 04/29/2023   ALKPHOS 79 04/29/2023   BILITOT 0.4 04/29/2023   No results found for: "25OHVITD2", "25OHVITD3", "VD25OH"   Patient Active Problem List   Diagnosis Date Noted   Retained intrauterine contraceptive device (IUD) 08/12/2021   Primary hyperparathyroidism (HCC) 08/12/2021   Type II diabetes mellitus with complication (HCC) 04/20/2019   Hyperlipidemia associated with type 2 diabetes mellitus (HCC) 08/11/2018   Benign neoplasm of transverse colon    Tubular adenoma of colon    Coronary artery disease involving native heart 05/02/2015   Gastroesophageal reflux disease 05/02/2015   Benign hypertension 05/02/2015    No Known Allergies  Past Surgical History:  Procedure Laterality Date   CARPAL TUNNEL RELEASE Bilateral    right - 14 yrs ago.  left - 06/2017   COLONOSCOPY WITH PROPOFOL N/A 04/29/2018   Procedure: COLONOSCOPY WITH BIOPSY;  Surgeon: Midge Minium, MD;  Location: [facility];  Service: Endoscopy;  Laterality: N/A;   POLYPECTOMY N/A 04/29/2018   Procedure: POLYPECTOMY;  Surgeon: Midge Minium, MD;  Location: [facility];  Service: Endoscopy;  Laterality: N/A;    Social History   Tobacco Use   Smoking status: Never   Smokeless tobacco: Never  Vaping Use   Vaping status: Never Used  Substance Use Topics  Alcohol use: Yes    Comment: socially   Drug use: No     Medication list has been reviewed and updated.  Current Meds  Medication Sig   aspirin 81 MG tablet Take 1 tablet by mouth daily.   enalapril (VASOTEC) 10 MG tablet TAKE 1 TABLET BY MOUTH DAILY   Insulin Pen Needle (PEN NEEDLES) 32G X 5 MM MISC 1 each by Does not apply route once a week.   meloxicam (MOBIC) 15 MG tablet TAKE 1 TABLET BY MOUTH DAILY   metFORMIN (GLUCOPHAGE-XR) 500  MG 24 hr tablet TAKE 1 TABLET BY MOUTH DAILY  WITH BREAKFAST   rosuvastatin (CRESTOR) 10 MG tablet TAKE 1 TABLET BY MOUTH DAILY   spironolactone (ALDACTONE) 25 MG tablet TAKE 1 TABLET BY MOUTH DAILY   VITAMIN D PO Take 1 tablet by mouth daily.   zinc gluconate 50 MG tablet Take 50 mg by mouth daily.   [DISCONTINUED] Semaglutide, 1 MG/DOSE, (OZEMPIC, 1 MG/DOSE,) 4 MG/3ML SOPN Inject 1 mg into the skin once a week.       09/21/2023   10:29 AM 04/29/2023    8:57 AM 09/30/2022    9:40 AM 08/26/2022    9:40 AM  GAD 7 : Generalized Anxiety Score  Nervous, Anxious, on Edge 0 0 0 0  Control/stop worrying 1 0 0 0  Worry too much - different things 1 0 0 0  Trouble relaxing 1 0 0 0  Restless 1 0 0 0  Easily annoyed or irritable 0 0 0 0  Afraid - awful might happen 1 0 0 0  Total GAD 7 Score 5 0 0 0  Anxiety Difficulty  Not difficult at all Not difficult at all Not difficult at all       09/21/2023   10:28 AM 04/29/2023    8:57 AM 09/30/2022    9:40 AM  Depression screen PHQ 2/9  Decreased Interest 0 0 0  Down, Depressed, Hopeless 1 0 0  PHQ - 2 Score 1 0 0  Altered sleeping 1 1 0  Tired, decreased energy 0 0 0  Change in appetite 1 0 0  Feeling bad or failure about yourself  0 0 0  Trouble concentrating 0 0 0  Moving slowly or fidgety/restless 0 0 0  Suicidal thoughts 0 0 0  PHQ-9 Score 3 1 0  Difficult doing work/chores  Not difficult at all Not difficult at all    BP Readings from Last 3 Encounters:  09/21/23 132/86  04/29/23 122/78  12/30/22 124/79    Physical Exam Constitutional:      Appearance: Normal appearance.  Neck:     Vascular: No carotid bruit.  Cardiovascular:     Rate and Rhythm: Normal rate and regular rhythm.     Pulses: Normal pulses.     Heart sounds: No murmur heard. Pulmonary:     Effort: No respiratory distress.     Breath sounds: No wheezing or rhonchi.  Musculoskeletal:     Cervical back: Normal range of motion.     Right lower leg: No edema.      Left lower leg: No edema.  Lymphadenopathy:     Cervical: No cervical adenopathy.  Neurological:     Mental Status: She is alert.  Psychiatric:        Mood and Affect: Mood normal.        Behavior: Behavior normal.     Wt Readings from Last 3 Encounters:  09/21/23 191 lb  6 oz (86.8 kg)  04/29/23 198 lb (89.8 kg)  12/30/22 200 lb 3.2 oz (90.8 kg)    BP 132/86   Pulse 83   Ht 5' 5.5" (1.664 m)   Wt 191 lb 6 oz (86.8 kg)   SpO2 99%   BMI 31.36 kg/m   Assessment and Plan:  Problem List Items Addressed This Visit       Unprioritized   Benign hypertension (Chronic)   Blood pressure is well controlled in general.  Slightly elevated today since sudden death of her sister last week. Current medications are enalapril and spironolactone. Will continue same regimen along with efforts to limit dietary sodium.       Hyperlipidemia associated with type 2 diabetes mellitus (HCC) (Chronic)   LDL is  Lab Results  Component Value Date   LDLCALC 97 04/29/2023   Current regimen is Crestor.  No medication side effects noted. Goal LDL is <70.       Relevant Medications   Semaglutide, 1 MG/DOSE, (OZEMPIC, 1 MG/DOSE,) 4 MG/3ML SOPN   Type II diabetes mellitus with complication (HCC) - Primary (Chronic)   Blood sugars have been stable.  No recent hypoglycemic events requiring assistance. Currently medications are ozempic and MTF. Lab Results  Component Value Date   HGBA1C 6.0 (A) 09/21/2023   Last visit no changes were made. A1c stable.       Relevant Medications   Semaglutide, 1 MG/DOSE, (OZEMPIC, 1 MG/DOSE,) 4 MG/3ML SOPN   Other Relevant Orders   POCT glycosylated hemoglobin (Hb A1C) (Completed)   Other Visit Diagnoses       Long-term current use of injectable noninsulin antidiabetic medication           Return in about 6 months (around 03/23/2024) for CPX.    Reubin Milan, MD [facility] [place]

## 2023-09-21 NOTE — Assessment & Plan Note (Signed)
 Blood sugars have been stable.  No recent hypoglycemic events requiring assistance. Currently medications are ozempic and MTF. Lab Results  Component Value Date   HGBA1C 6.0 (A) 09/21/2023   Last visit no changes were made. A1c stable.

## 2023-09-21 NOTE — Progress Notes (Unsigned)
 Date:  09/21/2023   Name:  Molly Kelly   DOB:  01-Jan-1956   MRN:  [id number]   Chief Complaint: No chief complaint on file.  HPI  Review of Systems   Lab Results  Component Value Date   NA 140 04/29/2023   K 4.2 04/29/2023   CO2 22 04/29/2023   GLUCOSE 89 04/29/2023   BUN 10 04/29/2023   CREATININE 0.66 04/29/2023   CALCIUM 10.6 (H) 04/29/2023   EGFR 97 04/29/2023   GFRNONAA 94 04/16/2020   Lab Results  Component Value Date   CHOL 150 04/29/2023   HDL 42 04/29/2023   LDLCALC 97 04/29/2023   TRIG 52 04/29/2023   CHOLHDL 3.6 04/29/2023   Lab Results  Component Value Date   TSH 1.120 04/29/2023   Lab Results  Component Value Date   HGBA1C 6.0 (H) 04/29/2023   Lab Results  Component Value Date   WBC 5.0 04/29/2023   HGB 13.3 04/29/2023   HCT 40.6 04/29/2023   MCV 95 04/29/2023   PLT 290 04/29/2023   Lab Results  Component Value Date   ALT 14 04/29/2023   AST 14 04/29/2023   ALKPHOS 79 04/29/2023   BILITOT 0.4 04/29/2023   No results found for: "25OHVITD2", "25OHVITD3", "VD25OH"   Patient Active Problem List   Diagnosis Date Noted   Retained intrauterine contraceptive device (IUD) 08/12/2021   Primary hyperparathyroidism (HCC) 08/12/2021   Type II diabetes mellitus with complication (HCC) 04/20/2019   Hyperlipidemia associated with type 2 diabetes mellitus (HCC) 08/11/2018   Benign neoplasm of transverse colon    Tubular adenoma of colon    Coronary artery disease involving native heart 05/02/2015   Gastroesophageal reflux disease 05/02/2015   Benign hypertension 05/02/2015    No Known Allergies  Past Surgical History:  Procedure Laterality Date   CARPAL TUNNEL RELEASE Bilateral    right - 14 yrs ago.  left - 06/2017   COLONOSCOPY WITH PROPOFOL N/A 04/29/2018   Procedure: COLONOSCOPY WITH BIOPSY;  Surgeon: Midge Minium, MD;  Location: [facility];  Service: Endoscopy;  Laterality: N/A;   POLYPECTOMY N/A 04/29/2018   Procedure:  POLYPECTOMY;  Surgeon: Midge Minium, MD;  Location: [facility];  Service: Endoscopy;  Laterality: N/A;    Social History   Tobacco Use   Smoking status: Never   Smokeless tobacco: Never  Vaping Use   Vaping status: Never Used  Substance Use Topics   Alcohol use: Yes    Comment: socially   Drug use: No     Medication list has been reviewed and updated.  No outpatient medications have been marked as taking for the 09/21/23 encounter (Orders Only) with Reubin Milan, MD.       04/29/2023    8:57 AM 09/30/2022    9:40 AM 08/26/2022    9:40 AM 06/05/2022   10:51 AM  GAD 7 : Generalized Anxiety Score  Nervous, Anxious, on Edge 0 0 0 0  Control/stop worrying 0 0 0 0  Worry too much - different things 0 0 0 0  Trouble relaxing 0 0 0 0  Restless 0 0 0 0  Easily annoyed or irritable 0 0 0 0  Afraid - awful might happen 0 0 0 0  Total GAD 7 Score 0 0 0 0  Anxiety Difficulty Not difficult at all Not difficult at all Not difficult at all Not difficult at all       04/29/2023  8:57 AM 09/30/2022    9:40 AM 08/26/2022    9:40 AM  Depression screen PHQ 2/9  Decreased Interest 0 0 0  Down, Depressed, Hopeless 0 0 0  PHQ - 2 Score 0 0 0  Altered sleeping 1 0 2  Tired, decreased energy 0 0 0  Change in appetite 0 0 0  Feeling bad or failure about yourself  0 0 0  Trouble concentrating 0 0 0  Moving slowly or fidgety/restless 0 0 0  Suicidal thoughts 0 0 0  PHQ-9 Score 1 0 2  Difficult doing work/chores Not difficult at all Not difficult at all Not difficult at all    BP Readings from Last 3 Encounters:  04/29/23 122/78  12/30/22 124/79  09/30/22 128/80    Physical Exam  Wt Readings from Last 3 Encounters:  04/29/23 198 lb (89.8 kg)  12/30/22 200 lb 3.2 oz (90.8 kg)  09/30/22 213 lb (96.6 kg)    There were no vitals taken for this visit.  Assessment and Plan:  Problem List Items Addressed This Visit   None   No follow-ups on file.    Reubin Milan, MD [facility] [place]

## 2023-09-21 NOTE — Assessment & Plan Note (Signed)
 Blood pressure is well controlled in general.  Slightly elevated today since sudden death of her sister last week. Current medications are enalapril and spironolactone. Will continue same regimen along with efforts to limit dietary sodium.

## 2023-12-31 ENCOUNTER — Other Ambulatory Visit: Payer: Self-pay | Admitting: Internal Medicine

## 2023-12-31 DIAGNOSIS — E1169 Type 2 diabetes mellitus with other specified complication: Secondary | ICD-10-CM

## 2023-12-31 DIAGNOSIS — E118 Type 2 diabetes mellitus with unspecified complications: Secondary | ICD-10-CM

## 2023-12-31 DIAGNOSIS — M25561 Pain in right knee: Secondary | ICD-10-CM

## 2023-12-31 DIAGNOSIS — I10 Essential (primary) hypertension: Secondary | ICD-10-CM

## 2024-01-05 NOTE — Telephone Encounter (Signed)
 Requested Prescriptions  Pending Prescriptions Disp Refills   meloxicam  (MOBIC ) 15 MG tablet [Pharmacy Med Name: Meloxicam  15 MG Oral Tablet] 90 tablet 0    Sig: TAKE 1 TABLET BY MOUTH DAILY     Analgesics:  COX2 Inhibitors Failed - 01/05/2024 10:29 AM      Failed - Manual Review: Labs are only required if the patient has taken medication for more than 8 weeks.      Passed - HGB in normal range and within 360 days    Hemoglobin  Date Value Ref Range Status  04/29/2023 13.3 11.1 - 15.9 g/dL Final         Passed - Cr in normal range and within 360 days    Creatinine, Ser  Date Value Ref Range Status  04/29/2023 0.66 0.57 - 1.00 mg/dL Final         Passed - HCT in normal range and within 360 days    Hematocrit  Date Value Ref Range Status  04/29/2023 40.6 34.0 - 46.6 % Final         Passed - AST in normal range and within 360 days    AST  Date Value Ref Range Status  04/29/2023 14 0 - 40 IU/L Final         Passed - ALT in normal range and within 360 days    ALT  Date Value Ref Range Status  04/29/2023 14 0 - 32 IU/L Final         Passed - eGFR is 30 or above and within 360 days    GFR calc Af Amer  Date Value Ref Range Status  04/16/2020 108 >59 mL/min/1.73 Final    Comment:    **In accordance with recommendations from the NKF-ASN Task force,**   [facility] is in the process of updating its eGFR calculation to the   2021 CKD-EPI creatinine equation that estimates kidney function   without a race variable.    GFR calc non Af Amer  Date Value Ref Range Status  04/16/2020 94 >59 mL/min/1.73 Final   eGFR  Date Value Ref Range Status  04/29/2023 97 >59 mL/min/1.73 Final         Passed - Patient is not pregnant      Passed - Valid encounter within last 12 months    Recent Outpatient Visits           3 months ago Type II diabetes mellitus with complication [facility])   [place] [facility] at [facility], Leita DEL, MD       Future  Appointments             In 2 months Justus Leita DEL, MD [facility] at [facility], [facility]             enalapril  (VASOTEC ) 10 MG tablet [Pharmacy Med Name: ENALAPRIL   10MG   TAB] 90 tablet 0    Sig: TAKE 1 TABLET BY MOUTH DAILY     Cardiovascular:  ACE Inhibitors Failed - 01/05/2024 10:29 AM      Failed - Cr in normal range and within 180 days    Creatinine, Ser  Date Value Ref Range Status  04/29/2023 0.66 0.57 - 1.00 mg/dL Final         Failed - K in normal range and within 180 days    Potassium  Date Value Ref Range Status  04/29/2023 4.2 3.5 - 5.2 mmol/L Final  Passed - Patient is not pregnant      Passed - Last BP in normal range    BP Readings from Last 1 Encounters:  09/21/23 132/86         Passed - Valid encounter within last 6 months    Recent Outpatient Visits           3 months ago Type II diabetes mellitus with complication [facility])   [place] [facility] at [facility], Leita DEL, MD       Future Appointments             In 2 months Justus Leita DEL, MD [facility] at [facility], [facility]             rosuvastatin  (CRESTOR ) 10 MG tablet [Pharmacy Med Name: Rosuvastatin  Calcium  10 MG Oral Tablet] 90 tablet 0    Sig: TAKE 1 TABLET BY MOUTH DAILY     Cardiovascular:  Antilipid - Statins 2 Failed - 01/05/2024 10:29 AM      Failed - Lipid Panel in normal range within the last 12 months    Cholesterol, Total  Date Value Ref Range Status  04/29/2023 150 100 - 199 mg/dL Final   LDL Chol Calc (NIH)  Date Value Ref Range Status  04/29/2023 97 0 - 99 mg/dL Final   HDL  Date Value Ref Range Status  04/29/2023 42 >39 mg/dL Final   Triglycerides  Date Value Ref Range Status  04/29/2023 52 0 - 149 mg/dL Final         Passed - Cr in normal range and within 360 days    Creatinine, Ser  Date Value Ref Range Status  04/29/2023 0.66 0.57 - 1.00  mg/dL Final         Passed - Patient is not pregnant      Passed - Valid encounter within last 12 months    Recent Outpatient Visits           3 months ago Type II diabetes mellitus with complication [facility])   [place] [facility] at [facility], Leita DEL, MD       Future Appointments             In 2 months Justus Leita DEL, MD [facility] at [facility], [facility]             metFORMIN  (GLUCOPHAGE -XR) 500 MG 24 hr tablet [Pharmacy Med Name: metFORMIN  HCl ER 500 MG Oral Tablet Extended Release 24 Hour] 90 tablet 0    Sig: TAKE 1 TABLET BY MOUTH DAILY  WITH BREAKFAST     Endocrinology:  Diabetes - Biguanides Failed - 01/05/2024 10:29 AM      Failed - B12 Level in normal range and within 720 days    No results found for: VITAMINB12       Passed - Cr in normal range and within 360 days    Creatinine, Ser  Date Value Ref Range Status  04/29/2023 0.66 0.57 - 1.00 mg/dL Final         Passed - HBA1C is between 0 and 7.9 and within 180 days    Hemoglobin A1C  Date Value Ref Range Status  09/21/2023 6.0 (A) 4.0 - 5.6 % Final   Hgb A1c MFr Bld  Date Value Ref Range Status  04/29/2023 6.0 (H) 4.8 - 5.6 % Final    Comment:  Prediabetes: 5.7 - 6.4          Diabetes: >6.4          Glycemic control for adults with diabetes: <7.0          Passed - eGFR in normal range and within 360 days    GFR calc Af Amer  Date Value Ref Range Status  04/16/2020 108 >59 mL/min/1.73 Final    Comment:    **In accordance with recommendations from the NKF-ASN Task force,**   [facility] is in the process of updating its eGFR calculation to the   2021 CKD-EPI creatinine equation that estimates kidney function   without a race variable.    GFR calc non Af Amer  Date Value Ref Range Status  04/16/2020 94 >59 mL/min/1.73 Final   eGFR  Date Value Ref Range Status  04/29/2023 97 >59 mL/min/1.73 Final          Passed - Valid encounter within last 6 months    Recent Outpatient Visits           3 months ago Type II diabetes mellitus with complication [facility])   [place] [facility] at [facility], Leita DEL, MD       Future Appointments             In 2 months Justus Leita DEL, MD [facility] Medicine at [facility], [facility]            Passed - CBC within normal limits and completed in the last 12 months    WBC  Date Value Ref Range Status  04/29/2023 5.0 3.4 - 10.8 x10E3/uL Final   RBC  Date Value Ref Range Status  04/29/2023 4.26 3.77 - 5.28 x10E6/uL Final   Hemoglobin  Date Value Ref Range Status  04/29/2023 13.3 11.1 - 15.9 g/dL Final   Hematocrit  Date Value Ref Range Status  04/29/2023 40.6 34.0 - 46.6 % Final   MCHC  Date Value Ref Range Status  04/29/2023 32.8 31.5 - 35.7 g/dL Final   [facility]  Date Value Ref Range Status  04/29/2023 31.2 26.6 - 33.0 pg Final   MCV  Date Value Ref Range Status  04/29/2023 95 79 - 97 fL Final   No results found for: PLTCOUNTKUC, LABPLAT, POCPLA RDW  Date Value Ref Range Status  04/29/2023 12.5 11.7 - 15.4 % Final          spironolactone  (ALDACTONE ) 25 MG tablet [Pharmacy Med Name: Spironolactone  25 MG Oral Tablet] 90 tablet 0    Sig: TAKE 1 TABLET BY MOUTH DAILY     Cardiovascular: Diuretics - Aldosterone Antagonist Failed - 01/05/2024 10:29 AM      Failed - Cr in normal range and within 180 days    Creatinine, Ser  Date Value Ref Range Status  04/29/2023 0.66 0.57 - 1.00 mg/dL Final         Failed - K in normal range and within 180 days    Potassium  Date Value Ref Range Status  04/29/2023 4.2 3.5 - 5.2 mmol/L Final         Failed - Na in normal range and within 180 days    Sodium  Date Value Ref Range Status  04/29/2023 140 134 - 144 mmol/L Final         Failed - eGFR is 30 or above and within 180 days    GFR calc Af [place]  Date Value Ref  Range Status   04/16/2020 108 >59 mL/min/1.73 Final    Comment:    **In accordance with recommendations from the NKF-ASN Task force,**   [facility] is in the process of updating its eGFR calculation to the   2021 CKD-EPI creatinine equation that estimates kidney function   without a race variable.    GFR calc non Af Amer  Date Value Ref Range Status  04/16/2020 94 >59 mL/min/1.73 Final   eGFR  Date Value Ref Range Status  04/29/2023 97 >59 mL/min/1.73 Final         Passed - Last BP in normal range    BP Readings from Last 1 Encounters:  09/21/23 132/86         Passed - Valid encounter within last 6 months    Recent Outpatient Visits           3 months ago Type II diabetes mellitus with complication [facility])   [place] [facility] at [facility], Leita DEL, MD       Future Appointments             In 2 months Justus, Leita DEL, MD [facility] Medicine at [facility], [facility]

## 2024-02-23 ENCOUNTER — Ambulatory Visit (HOSPITAL_COMMUNITY): Payer: Self-pay

## 2024-02-23 ENCOUNTER — Encounter: Payer: Self-pay | Admitting: Emergency Medicine

## 2024-02-23 ENCOUNTER — Ambulatory Visit
Admission: EM | Admit: 2024-02-23 | Discharge: 2024-02-23 | Disposition: A | Discharge status: Home / Self Care | Attending: Emergency Medicine | Admitting: Emergency Medicine

## 2024-02-23 DIAGNOSIS — U071 COVID-19: Secondary | ICD-10-CM | POA: Insufficient documentation

## 2024-02-23 LAB — BASIC METABOLIC PANEL WITH GFR
Anion gap: 8 (ref 5–15)
BUN: 9 mg/dL (ref 8–23)
CO2: 25 mmol/L (ref 22–32)
Calcium: 9.8 mg/dL (ref 8.9–10.3)
Chloride: 103 mmol/L (ref 98–111)
Creatinine, Ser: 0.49 mg/dL (ref 0.44–1.00)
GFR, Estimated: >60 mL/min (ref >60)
Glucose, Bld: 88 mg/dL (ref 70–99)
Potassium: 4 mmol/L (ref 3.5–5.1)
Sodium: 136 mmol/L (ref 135–145)

## 2024-02-23 LAB — SARS CORONAVIRUS 2 BY RT PCR: SARS Coronavirus 2 by RT PCR: POSITIVE — AB

## 2024-02-23 MED ORDER — NIRMATRELVIR/RITONAVIR (PAXLOVID)TABLET: 3.0000 | ORAL_TABLET | Freq: Two times a day (BID) | ORAL | 0 refills | Status: AC

## 2024-02-23 MED ORDER — PROMETHAZINE-DM 6.25-15 MG/5ML PO SYRP: 5.0000 mL | ORAL_SOLUTION | Freq: Four times a day (QID) | ORAL | 0 refills | Status: DC | PRN

## 2024-02-23 MED ORDER — IPRATROPIUM BROMIDE 0.06 % NA SOLN: 2.0000 | Freq: Four times a day (QID) | NASAL | 0 refills | Status: DC

## 2024-02-23 MED ORDER — IBUPROFEN 600 MG PO TABS: 600.0000 mg | ORAL_TABLET | Freq: Four times a day (QID) | ORAL | 0 refills | Status: AC | PRN

## 2024-02-23 NOTE — Discharge Instructions (Addendum)
 Your COVID is positive.  I have prescribed Paxlovid .  Go to [email or web address] and enter in your information, and you will get the Paxlovid  at a significantly reduced price, if not for free.  Go to the ER for difficulty breathing, if you get worse, or further concerns.   Wait several hours to go pick up your medications.  I have to dose the Paxlovid  based on your lab results which should be back in about an hour  Stop the Crestor  while taking the Paxlovid .  Use ipratropium nasal spray as written.  Saline nasal irrigation with a NeilMed rinse and distilled water  as often as you want.  Promethazine  DM for cough.  600 mg of ibuprofen  combined 1000 mg of Tylenol 3-4 times a day as needed for pain.  Do not take Mobic  if taking ibuprofen .

## 2024-02-23 NOTE — ED Triage Notes (Signed)
 Pt presents with a cough, head congestion and back pain x 3 days. Pt has taken NyQuil for her symptoms.

## 2024-02-23 NOTE — ED Provider Notes (Signed)
 HPI  SUBJECTIVE:  Molly Kelly is a 68 y.o. female who presents with 3 days of nasal congestion, clear rhinorrhea, cough productive of clear sputum, intermittent, minutes long posterior thoracic pain described as achy that is primarily associated with coughing.  She reports maxillary sinus pain and pressure and a brief episode of wheezing last night.  None since.  No fevers, body aches, headaches, facial swelling, upper dental pain, sore throat, postnasal drip, shortness of breath, dyspnea on exertion, chest pain.  She is waking up coughing.  No nausea, vomiting, diarrhea, abdominal pain.  No known COVID or flu exposure.  She had 3 doses of the COVID-vaccine and last year's flu vaccine.  She states that overall she feels okay.  No antipyretic in the past 6 hours.  No antibiotics in the past 3 months.  She tried Tylenol and NyQuil with improvement in her symptoms.  No aggravating factors.  She has a past medical history of hypercholesterolemia, hypertension, coronary disease, diabetes, BMI above 30, primary hyperparathyroidism.  PCP: [place] [facility]  Past Medical History:  Diagnosis Date   Acid reflux    Hypercholesteremia    Hypertension    Type 2 diabetes mellitus with complication (HCC)     Past Surgical History:  Procedure Laterality Date   CARPAL TUNNEL RELEASE Bilateral    right - 14 yrs ago.  left - 06/2017   COLONOSCOPY WITH PROPOFOL  N/A 04/29/2018   Procedure: COLONOSCOPY WITH BIOPSY;  Surgeon: Jinny Carmine, MD;  Location: [facility];  Service: Endoscopy;  Laterality: N/A;   POLYPECTOMY N/A 04/29/2018   Procedure: POLYPECTOMY;  Surgeon: Jinny Carmine, MD;  Location: [facility];  Service: Endoscopy;  Laterality: N/A;    Family History  Problem Relation Age of Onset   Hypertension Mother    Uterine cancer Mother    CAD Brother    Breast cancer Cousin        mat cousin    Social History   Tobacco Use   Smoking status: Never   Smokeless  tobacco: Never  Vaping Use   Vaping status: Never Used  Substance Use Topics   Alcohol use: Yes    Comment: socially   Drug use: No    No current facility-administered medications for this encounter.  Current Outpatient Medications:    ibuprofen  (ADVIL ) 600 MG tablet, Take 1 tablet (600 mg total) by mouth every 6 (six) hours as needed., Disp: 30 tablet, Rfl: 0   ipratropium (ATROVENT ) 0.06 % nasal spray, Place 2 sprays into both nostrils 4 (four) times daily., Disp: 15 mL, Rfl: 0   nirmatrelvir /ritonavir  (PAXLOVID ) 20 x 150 MG & 10 x 100MG  TABS, Take 3 tablets by mouth 2 (two) times daily for 5 days. Patient GFR is >60. Take nirmatrelvir  (150 mg) two tablets twice daily for 5 days and ritonavir  (100 mg) one tablet twice daily for 5 days., Disp: 30 tablet, Rfl: 0   promethazine -dextromethorphan (PROMETHAZINE -DM) 6.25-15 MG/5ML syrup, Take 5 mLs by mouth 4 (four) times daily as needed for cough., Disp: 118 mL, Rfl: 0   aspirin 81 MG tablet, Take 1 tablet by mouth daily., Disp: , Rfl:    enalapril  (VASOTEC ) 10 MG tablet, TAKE 1 TABLET BY MOUTH DAILY, Disp: 90 tablet, Rfl: 0   Insulin Pen Needle (PEN NEEDLES) 32G X 5 MM MISC, 1 each by Does not apply route once a week., Disp: 50 each, Rfl: 0   [Paused] meloxicam  (MOBIC ) 15 MG tablet, TAKE 1 TABLET BY MOUTH DAILY,  Disp: 90 tablet, Rfl: 0   metFORMIN  (GLUCOPHAGE -XR) 500 MG 24 hr tablet, TAKE 1 TABLET BY MOUTH DAILY  WITH BREAKFAST, Disp: 90 tablet, Rfl: 0   [Paused] rosuvastatin  (CRESTOR ) 10 MG tablet, TAKE 1 TABLET BY MOUTH DAILY, Disp: 90 tablet, Rfl: 0   Semaglutide , 1 MG/DOSE, (OZEMPIC , 1 MG/DOSE,) 4 MG/3ML SOPN, Inject 1 mg into the skin once a week., Disp: 3 mL, Rfl: 5   spironolactone  (ALDACTONE ) 25 MG tablet, TAKE 1 TABLET BY MOUTH DAILY, Disp: 90 tablet, Rfl: 0   VITAMIN D PO, Take 1 tablet by mouth daily., Disp: , Rfl:    zinc gluconate 50 MG tablet, Take 50 mg by mouth daily., Disp: , Rfl:   No Known Allergies   ROS  As noted in  HPI.   Physical Exam  BP 132/86 (BP Location: Left Arm)   Pulse 78   Temp 98.5 F (36.9 C) (Oral)   Resp 18   Wt 85.3 kg   SpO2 100%   BMI 30.81 kg/m   Constitutional: Well developed, well nourished, no acute distress Eyes: PERRL, EOMI, conjunctiva normal bilaterally HENT: Normocephalic, atraumatic,mucus membranes moist.  Positive nasal congestion.  Swollen, erythematous tremors.  No maxillary, frontal sinus tenderness.  Positive cobblestoning. Neck: Positive cervical lymphadenopathy Respiratory: Clear to auscultation bilaterally, no rales, no wheezing, no rhonchi.  No anterior, posterior chest wall tenderness.  Back pain aggravated with torso rotation. Cardiovascular: Normal rate and rhythm, no murmurs, no gallops, no rubs GI: nondistended skin: No rash, skin intact Musculoskeletal: no deformities Neurologic: Alert & oriented x 3, CN III-XII grossly intact, no motor deficits, sensation grossly intact Psychiatric: Speech and behavior appropriate   ED Course   Medications - No data to display  Orders Placed This Encounter  Procedures   SARS Coronavirus 2 by RT PCR (hospital order, performed in [facility] hospital lab) *cepheid single result test* Anterior Nasal Swab    Standing Status:   Standing    Number of Occurrences:   1   Basic metabolic panel    Standing Status:   Standing    Number of Occurrences:   1   Results for orders placed or performed during the hospital encounter of 02/23/24 (from the past 24 hours)  SARS Coronavirus 2 by RT PCR (hospital order, performed in [facility] hospital lab) *cepheid single result test* Anterior Nasal Swab     Status: Abnormal   Collection Time: 02/23/24  1:28 PM   Specimen: Anterior Nasal Swab  Result Value Ref Range   SARS Coronavirus 2 by RT PCR POSITIVE (A) NEGATIVE  Basic metabolic panel     Status: None   Collection Time: 02/23/24  2:06 PM  Result Value Ref Range   Sodium 136 135 - 145 mmol/L   Potassium 4.0 3.5 - 5.1  mmol/L   Chloride 103 98 - 111 mmol/L   CO2 25 22 - 32 mmol/L   Glucose, Bld 88 70 - 99 mg/dL   BUN 9 8 - 23 mg/dL   Creatinine, Ser 9.50 0.44 - 1.00 mg/dL   Calcium  9.8 8.9 - 10.3 mg/dL   GFR, Estimated >39 >39 mL/min   Anion gap 8 5 - 15   No results found.  ED Clinical Impression  1. COVID-19 virus infection      ED Assessment/Plan    COVID-positive.  Discussed with patient while in department.  I suspect that the posterior thoracic pain is musculoskeletal from coughing as it is reproducible with torso rotation/or movement.  She qualifies for Paxlovid  due to age above 37, diabetes, BMI above 30.  Will check a basic metabolic panel as last results were in October 2024.  Home with Paxlovid , Atrovent  nasal spray, Promethazine  DM, Tylenol/ibuprofen , saline nasal irrigation.  Follow-up with PCP as needed.  Discussed that she must mask for 8 more days.  ER return precautions given  She is to hold the Crestor  while taking the Paxlovid .  GFR above 60.  Prescribing regular dose Paxlovid .  Will have staff notify patient of normal kidney function and of prescription waiting for her at the pharmacy.  We discussed getting it through [email or web address] for reduced price/free.  Discussed labs, MDM, treatment plan, and plan for follow-up with patient Discussed sn/sx that should prompt return to the ED. patient agrees with plan.   Meds ordered this encounter  Medications   promethazine -dextromethorphan (PROMETHAZINE -DM) 6.25-15 MG/5ML syrup    Sig: Take 5 mLs by mouth 4 (four) times daily as needed for cough.    Dispense:  118 mL    Refill:  0   ipratropium (ATROVENT ) 0.06 % nasal spray    Sig: Place 2 sprays into both nostrils 4 (four) times daily.    Dispense:  15 mL    Refill:  0   ibuprofen  (ADVIL ) 600 MG tablet    Sig: Take 1 tablet (600 mg total) by mouth every 6 (six) hours as needed.    Dispense:  30 tablet    Refill:  0   nirmatrelvir /ritonavir  (PAXLOVID ) 20 x 150 MG & 10 x 100MG   TABS    Sig: Take 3 tablets by mouth 2 (two) times daily for 5 days. Patient GFR is >60. Take nirmatrelvir  (150 mg) two tablets twice daily for 5 days and ritonavir  (100 mg) one tablet twice daily for 5 days.    Dispense:  30 tablet    Refill:  0      *This clinic note was created using Scientist, clinical (histocompatibility and immunogenetics). Therefore, there may be occasional mistakes despite careful proofreading. ?    Van Knee, MD 02/23/24 1453

## 2024-03-06 ENCOUNTER — Other Ambulatory Visit: Payer: Self-pay | Admitting: Internal Medicine

## 2024-03-06 DIAGNOSIS — M25561 Pain in right knee: Secondary | ICD-10-CM

## 2024-03-06 DIAGNOSIS — I10 Essential (primary) hypertension: Secondary | ICD-10-CM

## 2024-03-06 DIAGNOSIS — E1169 Type 2 diabetes mellitus with other specified complication: Secondary | ICD-10-CM

## 2024-03-06 DIAGNOSIS — E118 Type 2 diabetes mellitus with unspecified complications: Secondary | ICD-10-CM

## 2024-03-08 NOTE — Telephone Encounter (Signed)
 Too soon for refill, LRF 01/05/24 for 90 and 1 RF.  Requested Prescriptions  Pending Prescriptions Disp Refills   rosuvastatin  (CRESTOR ) 10 MG tablet [Pharmacy Med Name: Rosuvastatin  Calcium  10 MG Oral Tablet] 90 tablet 3    Sig: TAKE 1 TABLET BY MOUTH DAILY     Cardiovascular:  Antilipid - Statins 2 Failed - 03/08/2024  8:54 AM      Failed - Lipid Panel in normal range within the last 12 months    Cholesterol, Total  Date Value Ref Range Status  04/29/2023 150 100 - 199 mg/dL Final   LDL Chol Calc (NIH)  Date Value Ref Range Status  04/29/2023 97 0 - 99 mg/dL Final   HDL  Date Value Ref Range Status  04/29/2023 42 >39 mg/dL Final   Triglycerides  Date Value Ref Range Status  04/29/2023 52 0 - 149 mg/dL Final         Passed - Cr in normal range and within 360 days    Creatinine, Ser  Date Value Ref Range Status  02/23/2024 0.49 0.44 - 1.00 mg/dL Final         Passed - Patient is not pregnant      Passed - Valid encounter within last 12 months    Recent Outpatient Visits           5 months ago Type II diabetes mellitus with complication [facility])   [place] [facility] at [facility], Leita DEL, MD       Future Appointments             In 2 weeks Molly Leita DEL, MD [facility] at [facility], [phone] [address]             meloxicam  (MOBIC ) 15 MG tablet [Pharmacy Med Name: Meloxicam  15 MG Oral Tablet] 90 tablet 3    Sig: TAKE 1 TABLET BY MOUTH DAILY     Analgesics:  COX2 Inhibitors Failed - 03/08/2024  8:54 AM      Failed - Manual Review: Labs are only required if the patient has taken medication for more than 8 weeks.      Passed - HGB in normal range and within 360 days    Hemoglobin  Date Value Ref Range Status  04/29/2023 13.3 11.1 - 15.9 g/dL Final         Passed - Cr in normal range and within 360 days    Creatinine, Ser  Date Value Ref Range Status  02/23/2024 0.49 0.44 - 1.00 mg/dL  Final         Passed - HCT in normal range and within 360 days    Hematocrit  Date Value Ref Range Status  04/29/2023 40.6 34.0 - 46.6 % Final         Passed - AST in normal range and within 360 days    AST  Date Value Ref Range Status  04/29/2023 14 0 - 40 IU/L Final         Passed - ALT in normal range and within 360 days    ALT  Date Value Ref Range Status  04/29/2023 14 0 - 32 IU/L Final         Passed - eGFR is 30 or above and within 360 days    GFR calc Af Amer  Date Value Ref Range Status  04/16/2020 108 >59 mL/min/1.73 Final    Comment:    **In accordance with  recommendations from the NKF-ASN Task force,**   [facility] is in the process of updating its eGFR calculation to the   2021 CKD-EPI creatinine equation that estimates kidney function   without a race variable.    GFR, Estimated  Date Value Ref Range Status  02/23/2024 >60 >60 mL/min Final    Comment:    (NOTE) Calculated using the CKD-EPI Creatinine Equation (2021)    eGFR  Date Value Ref Range Status  04/29/2023 97 >59 mL/min/1.73 Final         Passed - Patient is not pregnant      Passed - Valid encounter within last 12 months    Recent Outpatient Visits           5 months ago Type II diabetes mellitus with complication [facility])   [place] [facility] at [facility], Leita DEL, MD       Future Appointments             In 2 weeks Molly Leita DEL, MD [facility] Primary Care & Sports Medicine at [facility], [phone] [place]             spironolactone  (ALDACTONE ) 25 MG tablet [Pharmacy Med Name: Spironolactone  25 MG Oral Tablet] 90 tablet 3    Sig: TAKE 1 TABLET BY MOUTH DAILY     Cardiovascular: Diuretics - Aldosterone Antagonist Passed - 03/08/2024  8:54 AM      Passed - Cr in normal range and within 180 days    Creatinine, Ser  Date Value Ref Range Status  02/23/2024 0.49 0.44 - 1.00 mg/dL Final         Passed - K in normal range and within 180  days    Potassium  Date Value Ref Range Status  02/23/2024 4.0 3.5 - 5.1 mmol/L Final         Passed - Na in normal range and within 180 days    Sodium  Date Value Ref Range Status  02/23/2024 136 135 - 145 mmol/L Final  04/29/2023 140 134 - 144 mmol/L Final         Passed - eGFR is 30 or above and within 180 days    GFR calc Af Amer  Date Value Ref Range Status  04/16/2020 108 >59 mL/min/1.73 Final    Comment:    **In accordance with recommendations from the NKF-ASN Task force,**   [facility] is in the process of updating its eGFR calculation to the   2021 CKD-EPI creatinine equation that estimates kidney function   without a race variable.    GFR, Estimated  Date Value Ref Range Status  02/23/2024 >60 >60 mL/min Final    Comment:    (NOTE) Calculated using the CKD-EPI Creatinine Equation (2021)    eGFR  Date Value Ref Range Status  04/29/2023 97 >59 mL/min/1.73 Final         Passed - Last BP in normal range    BP Readings from Last 1 Encounters:  02/23/24 132/86         Passed - Valid encounter within last 6 months    Recent Outpatient Visits           5 months ago Type II diabetes mellitus with complication [facility])   [place] [facility] at [facility], Leita DEL, MD       Future Appointments             In 2 weeks  Molly Leita DEL, MD [facility] Primary [facility] & Sports Medicine at [facility], [phone] [place]             enalapril  (VASOTEC ) 10 MG tablet [Pharmacy Med Name: ENALAPRIL   10MG   TAB] 90 tablet 3    Sig: TAKE 1 TABLET BY MOUTH DAILY     Cardiovascular:  ACE Inhibitors Passed - 03/08/2024  8:54 AM      Passed - Cr in normal range and within 180 days    Creatinine, Ser  Date Value Ref Range Status  02/23/2024 0.49 0.44 - 1.00 mg/dL Final         Passed - K in normal range and within 180 days    Potassium  Date Value Ref Range Status  02/23/2024 4.0 3.5 - 5.1 mmol/L Final         Passed - Patient is  not pregnant      Passed - Last BP in normal range    BP Readings from Last 1 Encounters:  02/23/24 132/86         Passed - Valid encounter within last 6 months    Recent Outpatient Visits           5 months ago Type II diabetes mellitus with complication [facility])   [place] [facility] at [facility], Leita DEL, MD       Future Appointments             In 2 weeks Molly Leita DEL, MD [facility] & [facility] Medicine at [facility], [phone] [place]             metFORMIN  (GLUCOPHAGE -XR) 500 MG 24 hr tablet [Pharmacy Med Name: metFORMIN  HCl ER 500 MG Oral Tablet Extended Release 24 Hour] 90 tablet 3    Sig: TAKE 1 TABLET BY MOUTH DAILY  WITH BREAKFAST     Endocrinology:  Diabetes - Biguanides Failed - 03/08/2024  8:54 AM      Failed - B12 Level in normal range and within 720 days    No results found for: VITAMINB12       Passed - Cr in normal range and within 360 days    Creatinine, Ser  Date Value Ref Range Status  02/23/2024 0.49 0.44 - 1.00 mg/dL Final         Passed - HBA1C is between 0 and 7.9 and within 180 days    Hemoglobin A1C  Date Value Ref Range Status  09/21/2023 6.0 (A) 4.0 - 5.6 % Final   Hgb A1c MFr Bld  Date Value Ref Range Status  04/29/2023 6.0 (H) 4.8 - 5.6 % Final    Comment:             Prediabetes: 5.7 - 6.4          Diabetes: >6.4          Glycemic control for adults with diabetes: <7.0          Passed - eGFR in normal range and within 360 days    GFR calc Af Amer  Date Value Ref Range Status  04/16/2020 108 >59 mL/min/1.73 Final    Comment:    **In accordance with recommendations from the NKF-ASN Task force,**   [facility] is in the process of updating its eGFR calculation to the   2021 CKD-EPI creatinine equation that estimates kidney function   without a race variable.    GFR, Estimated  Date Value Ref Range Status  02/23/2024 >60 >60 mL/min Final    Comment:    (NOTE) Calculated  using the CKD-EPI Creatinine Equation (2021)    eGFR  Date Value Ref Range Status  04/29/2023 97 >59 mL/min/1.73 Final         Passed - Valid encounter within last 6 months    Recent Outpatient Visits           5 months ago Type II diabetes mellitus with complication [facility])   [place] [facility] at [facility], Leita DEL, MD       Future Appointments             In 2 weeks Molly Leita DEL, MD [facility] Primary Care & [facility] Medicine at [facility], [phone] [place]            Passed - CBC within normal limits and completed in the last 12 months    WBC  Date Value Ref Range Status  04/29/2023 5.0 3.4 - 10.8 x10E3/uL Final   RBC  Date Value Ref Range Status  04/29/2023 4.26 3.77 - 5.28 x10E6/uL Final   Hemoglobin  Date Value Ref Range Status  04/29/2023 13.3 11.1 - 15.9 g/dL Final   Hematocrit  Date Value Ref Range Status  04/29/2023 40.6 34.0 - 46.6 % Final   MCHC  Date Value Ref Range Status  04/29/2023 32.8 31.5 - 35.7 g/dL Final   [facility]  Date Value Ref Range Status  04/29/2023 31.2 26.6 - 33.0 pg Final   MCV  Date Value Ref Range Status  04/29/2023 95 79 - 97 fL Final   No results found for: PLTCOUNTKUC, LABPLAT, POCPLA RDW  Date Value Ref Range Status  04/29/2023 12.5 11.7 - 15.4 % Final

## 2024-03-23 ENCOUNTER — Ambulatory Visit: Admitting: Internal Medicine

## 2024-04-06 ENCOUNTER — Ambulatory Visit (INDEPENDENT_AMBULATORY_CARE_PROVIDER_SITE_OTHER): Admitting: Internal Medicine

## 2024-04-06 ENCOUNTER — Encounter: Payer: Self-pay | Admitting: Internal Medicine

## 2024-04-06 VITALS — BP 129/76 | HR 71 | Ht 65.5 in | Wt 193.4 lb

## 2024-04-06 DIAGNOSIS — Z23 Encounter for immunization: Secondary | ICD-10-CM

## 2024-04-06 DIAGNOSIS — Z7985 Long-term (current) use of injectable non-insulin antidiabetic drugs: Secondary | ICD-10-CM

## 2024-04-06 DIAGNOSIS — K219 Gastro-esophageal reflux disease without esophagitis: Secondary | ICD-10-CM

## 2024-04-06 DIAGNOSIS — E118 Type 2 diabetes mellitus with unspecified complications: Secondary | ICD-10-CM

## 2024-04-06 DIAGNOSIS — E210 Primary hyperparathyroidism: Secondary | ICD-10-CM

## 2024-04-06 DIAGNOSIS — Z1211 Encounter for screening for malignant neoplasm of colon: Secondary | ICD-10-CM | POA: Diagnosis not present

## 2024-04-06 DIAGNOSIS — E1169 Type 2 diabetes mellitus with other specified complication: Secondary | ICD-10-CM | POA: Diagnosis not present

## 2024-04-06 DIAGNOSIS — Z1231 Encounter for screening mammogram for malignant neoplasm of breast: Secondary | ICD-10-CM

## 2024-04-06 DIAGNOSIS — E785 Hyperlipidemia, unspecified: Secondary | ICD-10-CM

## 2024-04-06 DIAGNOSIS — I10 Essential (primary) hypertension: Secondary | ICD-10-CM

## 2024-04-06 MED ORDER — ENALAPRIL MALEATE 10 MG PO TABS: 10.0000 mg | ORAL_TABLET | Freq: Every day | ORAL | 1 refills | Status: AC

## 2024-04-06 MED ORDER — ROSUVASTATIN CALCIUM 10 MG PO TABS: 10.0000 mg | ORAL_TABLET | Freq: Every day | ORAL | 1 refills | Status: AC

## 2024-04-06 MED ORDER — SPIRONOLACTONE 25 MG PO TABS: 25.0000 mg | ORAL_TABLET | Freq: Every day | ORAL | 1 refills | Status: AC

## 2024-04-06 MED ORDER — METFORMIN HCL ER 500 MG PO TB24: 500.0000 mg | ORAL_TABLET | Freq: Every day | ORAL | 1 refills | Status: AC

## 2024-04-06 MED ORDER — OZEMPIC (1 MG/DOSE) 4 MG/3ML ~~LOC~~ SOPN: 1.0000 mg | PEN_INJECTOR | SUBCUTANEOUS | 5 refills | Status: AC

## 2024-04-06 NOTE — Assessment & Plan Note (Signed)
 LDL is  Lab Results  Component Value Date   LDLCALC 97 04/29/2023    Current medication regimen is Crestor . Goal LDL is < 70.

## 2024-04-06 NOTE — Assessment & Plan Note (Addendum)
 Currently medications are MTF and Ozempic .  No hypoglycemic episodes noted. Home blood sugars in the 130 range. Last visit medical regimen changes were none. She has increased the ozempic  interval recently due to stomach pain. Lab Results  Component Value Date   HGBA1C 6.0 (A) 09/21/2023

## 2024-04-06 NOTE — Assessment & Plan Note (Signed)
 Well controlled blood pressure today. Current regimen is enalapril  and spironolactone . No medication side effects noted.

## 2024-04-06 NOTE — Assessment & Plan Note (Signed)
 Mild worsening of epigastric discomfort recently. Recommend 2 weeks trial of Pepcid daily then prn Follow up if worsening

## 2024-04-06 NOTE — Progress Notes (Signed)
 Date:  04/06/2024   Name:  Molly Kelly   DOB:  1955/08/02   MRN:  [id number]   Chief Complaint: Annual Exam Molly Kelly is a 68 y.o. female who presents today for her Complete Annual Exam. She feels well. She reports exercising 2-3 x weekly. She reports she is sleeping poorly. Breast complaints none.  Health Maintenance  Topic Date Due   Colon Cancer Screening  04/30/2023   COVID-19 Vaccine (4 - 2025-26 season) 02/29/2024   Hemoglobin A1C  03/23/2024   Yearly kidney health urinalysis for diabetes  04/28/2024   Medicare Annual Wellness Visit  04/28/2024   Eye exam for diabetics  06/02/2024   Breast Cancer Screening  07/07/2024   Yearly kidney function blood test for diabetes  02/22/2025   Complete foot exam   04/06/2025   DTaP/Tdap/Td vaccine (3 - Td or Tdap) 04/24/2032   Pneumococcal Vaccine for age over 60  Completed   Flu Shot  Completed   DEXA scan (bone density measurement)  Completed   Hepatitis C Screening  Completed   Zoster (Shingles) Vaccine  Completed   Meningitis B Vaccine  Aged Out    Diabetes Pertinent negatives for hypoglycemia include no dizziness, headaches or nervousness/anxiousness. Pertinent negatives for diabetes include no chest pain, no fatigue and no weakness.  Hyperlipidemia Pertinent negatives include no chest pain, myalgias or shortness of breath.  Hypertension Pertinent negatives include no chest pain, headaches, palpitations or shortness of breath.  Gastroesophageal Reflux She complains of heartburn. She reports no abdominal pain, no chest pain, no coughing or no wheezing. This is a recurrent problem. The problem occurs occasionally. The problem has been gradually worsening. The heartburn duration is several minutes. The heartburn is located in the substernum. The heartburn is of mild intensity. The heartburn does not wake her from sleep. The heartburn does not limit her activity. The heartburn doesn't change with position. Pertinent  negatives include no fatigue.    Review of Systems  Constitutional:  Negative for fatigue and unexpected weight change.  HENT:  Negative for trouble swallowing.   Eyes:  Negative for visual disturbance.  Respiratory:  Negative for cough, chest tightness, shortness of breath and wheezing.   Cardiovascular:  Negative for chest pain, palpitations and leg swelling.  Gastrointestinal:  Positive for heartburn. Negative for abdominal pain, constipation and diarrhea.  Genitourinary:  Negative for dysuria, pelvic pain, urgency and vaginal bleeding.  Musculoskeletal:  Negative for arthralgias and myalgias.  Skin:  Negative for color change and rash.  Neurological:  Negative for dizziness, weakness, light-headedness and headaches.  Psychiatric/Behavioral:  Negative for dysphoric mood and sleep disturbance. The patient is not nervous/anxious.      Lab Results  Component Value Date   NA 136 02/23/2024   K 4.0 02/23/2024   CO2 25 02/23/2024   GLUCOSE 88 02/23/2024   BUN 9 02/23/2024   CREATININE 0.49 02/23/2024   CALCIUM  9.8 02/23/2024   EGFR 97 04/29/2023   GFRNONAA >60 02/23/2024   Lab Results  Component Value Date   CHOL 150 04/29/2023   HDL 42 04/29/2023   LDLCALC 97 04/29/2023   TRIG 52 04/29/2023   CHOLHDL 3.6 04/29/2023   Lab Results  Component Value Date   TSH 1.120 04/29/2023   Lab Results  Component Value Date   HGBA1C 6.0 (A) 09/21/2023   Lab Results  Component Value Date   WBC 5.0 04/29/2023   HGB 13.3 04/29/2023   HCT 40.6 04/29/2023   MCV  95 04/29/2023   PLT 290 04/29/2023   Lab Results  Component Value Date   ALT 14 04/29/2023   AST 14 04/29/2023   ALKPHOS 79 04/29/2023   BILITOT 0.4 04/29/2023   No results found for: MARIEN BOLLS, VD25OH   Patient Active Problem List   Diagnosis Date Noted   Retained intrauterine contraceptive device (IUD) 08/12/2021   Primary hyperparathyroidism 08/12/2021   Type II diabetes mellitus with complication  (HCC) 04/20/2019   Hyperlipidemia associated with type 2 diabetes mellitus (HCC) 08/11/2018   Benign neoplasm of transverse colon    Tubular adenoma of colon    Coronary artery disease involving native heart 05/02/2015   Gastroesophageal reflux disease 05/02/2015   Benign hypertension 05/02/2015    No Known Allergies  Past Surgical History:  Procedure Laterality Date   CARPAL TUNNEL RELEASE Bilateral    right - 14 yrs ago.  left - 06/2017   COLONOSCOPY WITH PROPOFOL  N/A 04/29/2018   Procedure: COLONOSCOPY WITH BIOPSY;  Surgeon: Jinny Carmine, MD;  Location[facility];  Service: Endoscopy;  Laterality: N/A;   POLYPECTOMY N/A 04/29/2018   Procedure: POLYPECTOMY;  Surgeon: Jinny Carmine, MD;  Location: [facility];  Service: Endoscopy;  Laterality: N/A;    Social History   Tobacco Use   Smoking status: Never   Smokeless tobacco: Never  Vaping Use   Vaping status: Never Used  Substance Use Topics   Alcohol use: Yes    Comment: socially   Drug use: No     Medication list has been reviewed and updated.  Current Meds  Medication Sig   aspirin 81 MG tablet Take 1 tablet by mouth daily.   ibuprofen  (ADVIL ) 600 MG tablet Take 1 tablet (600 mg total) by mouth every 6 (six) hours as needed.   meloxicam  (MOBIC ) 15 MG tablet TAKE 1 TABLET BY MOUTH DAILY (Patient taking differently: TAKE 1 TABLET BY MOUTH DAILY)   VITAMIN D PO Take 1 tablet by mouth daily.   zinc gluconate 50 MG tablet Take 50 mg by mouth daily.   [DISCONTINUED] enalapril  (VASOTEC ) 10 MG tablet TAKE 1 TABLET BY MOUTH DAILY   [DISCONTINUED] metFORMIN  (GLUCOPHAGE -XR) 500 MG 24 hr tablet TAKE 1 TABLET BY MOUTH DAILY  WITH BREAKFAST   [DISCONTINUED] rosuvastatin  (CRESTOR ) 10 MG tablet TAKE 1 TABLET BY MOUTH DAILY   [DISCONTINUED] spironolactone  (ALDACTONE ) 25 MG tablet TAKE 1 TABLET BY MOUTH DAILY       04/06/2024    1:18 PM 09/21/2023   10:29 AM 04/29/2023    8:57 AM 09/30/2022    9:40 AM  GAD 7 :  Generalized Anxiety Score  Nervous, Anxious, on Edge 0 0 0 0  Control/stop worrying 0 1 0 0  Worry too much - different things 0 1 0 0  Trouble relaxing 0 1 0 0  Restless 0 1 0 0  Easily annoyed or irritable 0 0 0 0  Afraid - awful might happen 0 1 0 0  Total GAD 7 Score 0 5 0 0  Anxiety Difficulty Not difficult at all  Not difficult at all Not difficult at all       04/06/2024    1:18 PM 09/21/2023   10:28 AM 04/29/2023    8:57 AM  Depression screen PHQ 2/9  Decreased Interest 0 0 0  Down, Depressed, Hopeless 0 1 0  PHQ - 2 Score 0 1 0  Altered sleeping 3 1 1   Tired, decreased energy 0 0 0  Change  in appetite 0 1 0  Feeling bad or failure about yourself  0 0 0  Trouble concentrating 0 0 0  Moving slowly or fidgety/restless 0 0 0  Suicidal thoughts 0 0 0  PHQ-9 Score 3 3 1   Difficult doing work/chores Not difficult at all  Not difficult at all    BP Readings from Last 3 Encounters:  04/06/24 129/76  02/23/24 132/86  09/21/23 132/86    Physical Exam Vitals and nursing note reviewed.  Constitutional:      General: She is not in acute distress.    Appearance: She is well-developed.  HENT:     Head: Normocephalic and atraumatic.     Right Ear: Tympanic membrane and ear canal normal.     Left Ear: Tympanic membrane and ear canal normal.     Nose:     Right Sinus: No maxillary sinus tenderness.     Left Sinus: No maxillary sinus tenderness.  Eyes:     General: No scleral icterus.       Right eye: No discharge.        Left eye: No discharge.     Conjunctiva/sclera: Conjunctivae normal.  Neck:     Thyroid: No thyromegaly.     Vascular: No carotid bruit.  Cardiovascular:     Rate and Rhythm: Normal rate and regular rhythm.     Pulses: Normal pulses.     Heart sounds: Normal heart sounds.  Pulmonary:     Effort: Pulmonary effort is normal. No respiratory distress.     Breath sounds: No wheezing.  Abdominal:     General: Bowel sounds are normal.     Palpations:  Abdomen is soft.     Tenderness: There is no abdominal tenderness.  Musculoskeletal:     Cervical back: Normal range of motion. No erythema.     Right lower leg: No edema.     Left lower leg: No edema.  Lymphadenopathy:     Cervical: No cervical adenopathy.  Skin:    General: Skin is warm and dry.     Capillary Refill: Capillary refill takes less than 2 seconds.     Findings: No rash.  Neurological:     General: No focal deficit present.     Mental Status: She is alert and oriented to person, place, and time.     Cranial Nerves: No cranial nerve deficit.     Sensory: No sensory deficit.     Deep Tendon Reflexes: Reflexes are normal and symmetric.  Psychiatric:        Attention and Perception: Attention normal.        Mood and Affect: Mood normal.    Diabetic Foot Exam - Simple   Simple Foot Form Diabetic Foot exam was performed with the following findings: Yes 04/06/2024  1:33 PM  Visual Inspection No deformities, no ulcerations, no other skin breakdown bilaterally: Yes Sensation Testing Intact to touch and monofilament testing bilaterally: Yes Pulse Check Posterior Tibialis and Dorsalis pulse intact bilaterally: Yes Comments      Wt Readings from Last 3 Encounters:  04/06/24 193 lb 6.4 oz (87.7 kg)  02/23/24 188 lb (85.3 kg)  09/21/23 191 lb 6 oz (86.8 kg)    BP 129/76   Pulse 71   Ht 5' 5.5 (1.664 m)   Wt 193 lb 6.4 oz (87.7 kg)   SpO2 97%   BMI 31.69 kg/m   Assessment and Plan:  Problem List Items Addressed This Visit  Unprioritized   Benign hypertension - Primary (Chronic)   Well controlled blood pressure today. Current regimen is enalapril  and spironolactone . No medication side effects noted.        Relevant Medications   rosuvastatin  (CRESTOR ) 10 MG tablet   spironolactone  (ALDACTONE ) 25 MG tablet   enalapril  (VASOTEC ) 10 MG tablet   Other Relevant Orders   CBC with Differential/Platelet   Comprehensive metabolic panel with GFR   TSH    Gastroesophageal reflux disease (Chronic)   Mild worsening of epigastric discomfort recently. Recommend 2 weeks trial of Pepcid daily then prn Follow up if worsening      Hyperlipidemia associated with type 2 diabetes mellitus (HCC) (Chronic)   LDL is  Lab Results  Component Value Date   LDLCALC 97 04/29/2023    Current medication regimen is Crestor . Goal LDL is < 70.       Relevant Medications   Semaglutide , 1 MG/DOSE, (OZEMPIC , 1 MG/DOSE,) 4 MG/3ML SOPN   metFORMIN  (GLUCOPHAGE -XR) 500 MG 24 hr tablet   rosuvastatin  (CRESTOR ) 10 MG tablet   spironolactone  (ALDACTONE ) 25 MG tablet   enalapril  (VASOTEC ) 10 MG tablet   Other Relevant Orders   Lipid panel   Primary hyperparathyroidism (Chronic)   Relevant Orders   Parathyroid  hormone, intact (no Ca)   Type II diabetes mellitus with complication (HCC) (Chronic)   Currently medications are MTF and Ozempic .  No hypoglycemic episodes noted. Home blood sugars in the 130 range. Last visit medical regimen changes were none. She has increased the ozempic  interval recently due to stomach pain. Lab Results  Component Value Date   HGBA1C 6.0 (A) 09/21/2023          Relevant Medications   Semaglutide , 1 MG/DOSE, (OZEMPIC , 1 MG/DOSE,) 4 MG/3ML SOPN   metFORMIN  (GLUCOPHAGE -XR) 500 MG 24 hr tablet   rosuvastatin  (CRESTOR ) 10 MG tablet   enalapril  (VASOTEC ) 10 MG tablet   Other Relevant Orders   Comprehensive metabolic panel with GFR   Hemoglobin A1c   Microalbumin / creatinine urine ratio   Other Visit Diagnoses       Colon cancer screening       she will call to reschedule her colonoscopy     Encounter for screening mammogram for breast cancer       schedule at [facility]   Relevant Orders   MM 3D SCREENING MAMMOGRAM BILATERAL BREAST     Encounter for immunization       Relevant Orders   Flu vaccine HIGH DOSE PF(Fluzone Trivalent) (Completed)     Long-term current use of injectable noninsulin antidiabetic medication            Return in about 4 months (around 08/07/2024) for TOC  DM, HTN  Dan.    Leita HILARIO Adie, MD [facility] [place]

## 2024-04-06 NOTE — Patient Instructions (Signed)
 [address], [address] [place], [place] [address]                                  [place], [place] [address]   [phone] [phone]  Call the above number to schedule your colonoscopy   Call [facility] to schedule your mammogram at [phone].

## 2024-04-08 LAB — LIPID PANEL
Chol/HDL Ratio: 4.6 ratio — ABNORMAL HIGH (ref 0.0–4.4)
Cholesterol, Total: 203 mg/dL — ABNORMAL HIGH (ref 100–199)
HDL: 44 mg/dL (ref >39)
LDL Chol Calc (NIH): 146 mg/dL — ABNORMAL HIGH (ref 0–99)
Triglycerides: 72 mg/dL (ref 0–149)
VLDL Cholesterol Cal: 13 mg/dL (ref 5–40)

## 2024-04-08 LAB — COMPREHENSIVE METABOLIC PANEL WITH GFR
ALT: 11 IU/L (ref 0–32)
AST: 16 IU/L (ref 0–40)
Albumin: 4.7 g/dL (ref 3.9–4.9)
Alkaline Phosphatase: 73 IU/L (ref 49–135)
BUN/Creatinine Ratio: 11 — ABNORMAL LOW (ref 12–28)
BUN: 7 mg/dL — ABNORMAL LOW (ref 8–27)
Bilirubin Total: 0.6 mg/dL (ref 0.0–1.2)
CO2: 24 mmol/L (ref 20–29)
Calcium: 10.7 mg/dL — ABNORMAL HIGH (ref 8.7–10.3)
Chloride: 103 mmol/L (ref 96–106)
Creatinine, Ser: 0.65 mg/dL (ref 0.57–1.00)
Globulin, Total: 2.8 g/dL (ref 1.5–4.5)
Glucose: 79 mg/dL (ref 70–99)
Potassium: 4.2 mmol/L (ref 3.5–5.2)
Sodium: 139 mmol/L (ref 134–144)
Total Protein: 7.5 g/dL (ref 6.0–8.5)
eGFR: 97 mL/min/1.73 (ref >59)

## 2024-04-08 LAB — CBC WITH DIFFERENTIAL/PLATELET
Basophils Absolute: 0.1 x10E3/uL (ref 0.0–0.2)
Basos: 1 %
EOS (ABSOLUTE): 0.1 x10E3/uL (ref 0.0–0.4)
Eos: 3 %
Hematocrit: 42 % (ref 34.0–46.6)
Hemoglobin: 13.9 g/dL (ref 11.1–15.9)
Immature Grans (Abs): 0 x10E3/uL (ref 0.0–0.1)
Immature Granulocytes: 0 %
Lymphocytes Absolute: 2 x10E3/uL (ref 0.7–3.1)
Lymphs: 47 %
MCH: 32.1 pg (ref 26.6–33.0)
MCHC: 33.1 g/dL (ref 31.5–35.7)
MCV: 97 fL (ref 79–97)
Monocytes Absolute: 0.4 x10E3/uL (ref 0.1–0.9)
Monocytes: 10 %
Neutrophils Absolute: 1.7 x10E3/uL (ref 1.4–7.0)
Neutrophils: 39 %
Platelets: 297 x10E3/uL (ref 150–450)
RBC: 4.33 x10E6/uL (ref 3.77–5.28)
RDW: 12.6 % (ref 11.7–15.4)
WBC: 4.2 x10E3/uL (ref 3.4–10.8)

## 2024-04-08 LAB — TSH: TSH: 0.846 u[IU]/mL (ref 0.450–4.500)

## 2024-04-08 LAB — PARATHYROID HORMONE, INTACT (NO CA): PTH: 42 pg/mL (ref 15–65)

## 2024-04-08 LAB — MICROALBUMIN / CREATININE URINE RATIO
Creatinine, Urine: 152.1 mg/dL
Microalb/Creat Ratio: 15 mg/g{creat} (ref 0–29)
Microalbumin, Urine: 22.1 ug/mL

## 2024-04-08 LAB — HEMOGLOBIN A1C
Est. average glucose Bld gHb Est-mCnc: 123 mg/dL
Hgb A1c MFr Bld: 5.9 % — ABNORMAL HIGH (ref 4.8–5.6)

## 2024-04-11 ENCOUNTER — Ambulatory Visit: Payer: Self-pay | Admitting: Internal Medicine

## 2024-05-23 ENCOUNTER — Ambulatory Visit: Admission: EM | Admit: 2024-05-23 | Discharge: 2024-05-23 | Attending: Family Medicine | Admitting: Family Medicine

## 2024-05-23 DIAGNOSIS — R109 Unspecified abdominal pain: Secondary | ICD-10-CM | POA: Diagnosis not present

## 2024-05-23 DIAGNOSIS — M25549 Pain in joints of unspecified hand: Secondary | ICD-10-CM | POA: Insufficient documentation

## 2024-05-23 DIAGNOSIS — K5900 Constipation, unspecified: Secondary | ICD-10-CM

## 2024-05-23 DIAGNOSIS — R112 Nausea with vomiting, unspecified: Secondary | ICD-10-CM | POA: Diagnosis not present

## 2024-05-23 MED ORDER — LIDOCAINE VISCOUS HCL 2 % MT SOLN
15.0000 mL | Freq: Once | OROMUCOSAL | Status: AC
  Administered 2024-05-23: 15 mL via OROMUCOSAL

## 2024-05-23 MED ORDER — FAMOTIDINE 20 MG PO TABS
20.0000 mg | ORAL_TABLET | Freq: Once | ORAL | Status: AC
  Administered 2024-05-23: 20 mg via ORAL

## 2024-05-23 MED ORDER — ALUM & MAG HYDROXIDE-SIMETH 200-200-20 MG/5ML PO SUSP
30.0000 mL | Freq: Once | ORAL | Status: AC
  Administered 2024-05-23: 30 mL via ORAL

## 2024-05-23 NOTE — ED Triage Notes (Signed)
 Pt c/o upper abd pain,nausea & emesis x6 days. Had 1 episode of emesis today. States pain worse at night which causes her to wake up. Able to keep foods & fluids down. LBM:5 days ago.

## 2024-05-23 NOTE — ED Notes (Signed)
 Patient is being discharged from the Urgent Care and sent to the Emergency Department via POV . Per Dr. Kriste, patient is in need of higher level of care due to abdominal pain, constipation r/o obstruction. Patient is aware and verbalizes understanding of plan of care.  Vitals:   05/23/24 1708  BP: (!) 153/101  Pulse: 80  Resp: 18  Temp: 98.2 F (36.8 C)  SpO2: 97%

## 2024-05-23 NOTE — Discharge Instructions (Signed)
 You have belly pain with vomiting and constipation for about a week. You have been advised to follow up immediately in the emergency department for concerning signs or symptoms as discussed during your visit. I  Based on concerns about condition, if you do not follow up in the ED, you may risk poor outcomes including worsening of condition, delayed treatment and potentially life threatening issues. If you have declined to go to the ED at this time, you should call your PCP immediately to set up a follow up appointment.

## 2024-05-23 NOTE — ED Provider Notes (Signed)
 [facility]    CSN: [id number] Arrival date & time: 05/23/24  1648      History   Chief Complaint Chief Complaint  Patient presents with   Abdominal Pain   Emesis   Nausea    HPI Molly Kelly is a 68 y.o. female.   HPI  [name] presents for intermittent right upper abdominal pain with nausea and vomiting for the past 6 days. Pain gets better after she vomits. Non-radiating pain.  Reports 1 episode of vomiting today while in the store. It was mostly clear liquid. She last ate 2 crackers and drank a ginger ale around 2 PM today.  Had not been having an appetite today.  She has not had a bowel movement in 6-7 days.  She doesn't have regular bowel movements. She goes every 2-3 days.  Pain wakes her up from sleep.  Took something for acid reflux but her symptoms are not going away.  This is worse than her acid reflux.   Symptoms started after returning from a trip to [place].    She is due for a colonoscopy.     Past Surgeries: no previous abdominal surgeries but had a colonoscopy and endoscopy.    Symptoms Nausea/Vomiting: yes  Diarrhea: no  Constipation: yes  Melena/BRBPR: no  Hematemesis: no  Anorexia: yes  Fever/Chills: no  Dysuria: no  Rash: big round red spot on my leg where something bit me after the funeral.  Yellow jackets were flying around. This has mostly gone now  Wt loss: no  EtOH use: yes, while on her trip but not much of a drinker  NSAIDs/ASA: yes (81 mg aspirin daily LMP: postmenopausal  Vaginal bleeding: no  Sore throat: no   Cough: no Nasal congestion : no  Sleep disturbance: yes  Back Pain: no Headache: resolved   Past Medical History:  Diagnosis Date   Acid reflux    Hypercholesteremia    Hypertension    Type 2 diabetes mellitus with complication [facility])     Patient Active Problem List   Diagnosis Date Noted   Esophageal stricture 06/01/2024   Arthralgia of hand 05/23/2024   Retained intrauterine contraceptive  device (IUD) 08/12/2021   Primary hyperparathyroidism 08/12/2021   Type II diabetes mellitus with complication (HCC) 04/20/2019   Hyperlipidemia associated with type 2 diabetes mellitus (HCC) 08/11/2018   Benign neoplasm of transverse colon    Tubular adenoma of colon    Coronary artery disease involving native heart 05/02/2015   Gastroesophageal reflux disease 05/02/2015   Benign hypertension 05/02/2015    Past Surgical History:  Procedure Laterality Date   CARPAL TUNNEL RELEASE Bilateral    right - 14 yrs ago.  left - 06/2017   COLONOSCOPY WITH PROPOFOL  N/A 04/29/2018   Procedure: COLONOSCOPY WITH BIOPSY;  Surgeon: Jinny Carmine, MD;  Location: [facility];  Service: Endoscopy;  Laterality: N/A;   POLYPECTOMY N/A 04/29/2018   Procedure: POLYPECTOMY;  Surgeon: Jinny Carmine, MD;  Location: [facility];  Service: Endoscopy;  Laterality: N/A;    OB History   No obstetric history on file.      Home Medications    Prior to Admission medications   Medication Sig Start Date End Date Taking? Authorizing Provider  aspirin 81 MG tablet Take 1 tablet by mouth daily.    [provider]  enalapril  (VASOTEC ) 10 MG tablet Take 1 tablet (10 mg total) by mouth daily. 04/06/24   Justus Leita DEL, MD  ibuprofen  (ADVIL )  600 MG tablet Take 1 tablet (600 mg total) by mouth every 6 (six) hours as needed. 02/23/24   Van Knee, MD  meloxicam  (MOBIC ) 15 MG tablet TAKE 1 TABLET BY MOUTH DAILY Patient taking differently: TAKE 1 TABLET BY MOUTH DAILY 01/05/24   Berglund, Laura H, MD  metFORMIN  (GLUCOPHAGE -XR) 500 MG 24 hr tablet Take 1 tablet (500 mg total) by mouth daily with breakfast. 04/06/24   Justus Leita DEL, MD  pantoprazole  (PROTONIX ) 40 MG tablet Take 1 tablet (40 mg total) by mouth daily. 05/25/24   Justus Leita DEL, MD  rosuvastatin  (CRESTOR ) 10 MG tablet Take 1 tablet (10 mg total) by mouth daily. 04/06/24   Justus Leita DEL, MD  Semaglutide , 1 MG/DOSE, (OZEMPIC ,  1 MG/DOSE,) 4 MG/3ML SOPN Inject 1 mg into the skin once a week. 04/06/24   Justus Leita DEL, MD  spironolactone  (ALDACTONE ) 25 MG tablet Take 1 tablet (25 mg total) by mouth daily. 04/06/24   Justus Leita DEL, MD  VITAMIN D PO Take 1 tablet by mouth daily.    [provider]  zinc gluconate 50 MG tablet Take 50 mg by mouth daily.    [provider]    Family History Family History  Problem Relation Age of Onset   Hypertension Mother    Uterine cancer Mother    CAD Brother    Breast cancer Cousin        mat cousin    Social History Social History   Tobacco Use   Smoking status: Never   Smokeless tobacco: Never  Vaping Use   Vaping status: Never Used  Substance Use Topics   Alcohol use: Yes    Comment: socially   Drug use: No     Allergies   Patient has no known allergies.   Review of Systems Review of Systems :negative unless otherwise stated in HPI.      Physical Exam Triage Vital Signs ED Triage Vitals  Encounter Vitals Group     BP 05/23/24 1708 (!) 153/101     Girls Systolic BP Percentile --      Girls Diastolic BP Percentile --      Boys Systolic BP Percentile --      Boys Diastolic BP Percentile --      Pulse Rate 05/23/24 1708 80     Resp 05/23/24 1708 18     Temp 05/23/24 1708 98.2 F (36.8 C)     Temp Source 05/23/24 1708 Oral     SpO2 05/23/24 1708 97 %     Weight 05/23/24 1706 186 lb (84.4 kg)     Height --      Head Circumference --      Peak Flow --      Pain Score 05/23/24 1707 0     Pain Loc --      Pain Education --      Exclude from Growth Chart --    No data found.  Updated Vital Signs BP (!) 153/101 (BP Location: Left Arm)   Pulse 80   Temp 98.2 F (36.8 C) (Oral)   Resp 18   Wt 84.4 kg   SpO2 97%   BMI 30.48 kg/m   Visual Acuity Right Eye Distance:   Left Eye Distance:   Bilateral Distance:    Right Eye Near:   Left Eye Near:    Bilateral Near:     Physical Exam  GEN: non-toxic appearing  older female, in no acute distress  CV:  regular rate and rhythm RESP: no increased work of breathing, clear to ascultation bilaterally ABD: Bowel sounds present. Soft, generalized TTP, non-distended.  No guarding, no rebound, no appreciable hepatosplenomegaly,  no CVA tenderness, negative McBurney's, negative Murphy MSK: no extremity edema SKIN: warm, dry, small area mildly erythematous patch that is not raised or scaly on her right lower leg, no insect parts or discharge visible  NEURO: alert, moves all extremities appropriately PSYCH: Normal affect, appropriate speech and behavior   UC Treatments / Results  Labs (all labs ordered are listed, but only abnormal results are displayed) Labs Reviewed - No data to display  EKG  If EKG performed, see my interpretation and MDM section  Radiology    Procedures Procedures (including critical care time)  Medications Ordered in UC Medications  alum & mag hydroxide-simeth (MAALOX/MYLANTA) 200-200-20 MG/5ML suspension 30 mL (30 mLs Oral Given 05/23/24 1750)  lidocaine  (XYLOCAINE ) 2 % viscous mouth solution 15 mL (15 mLs Mouth/Throat Given 05/23/24 1750)  famotidine  (PEPCID ) tablet 20 mg (20 mg Oral Given 05/23/24 1749)    Initial Impression / Assessment and Plan / UC Course  I have reviewed the triage vital signs and the nursing notes.  Pertinent labs & imaging results that were available during my care of the patient were reviewed by me and considered in my medical decision making (see chart for details).       Patient is a  68 y.o. female who presents after having insidious  abdominal pain with vomiting and lack of bowel movement for about 6 days. On chart review, she was seen by gastroenterology for endoscopy and colonoscopy in Oct 2019. She has history of colon polyps. She is due to go back but hasn't scheduled the appointment.     Overall, patient is non-toxic-appearing, well-hydrated, and in no acute distress.  Vital signs  stable.  Lorraineis afebrile.  Exam is not concerning for an acute abdomen despite having generalized tenderness. Given GI cocktail as she has history of acid reflux.    DDX includes but not limited to: UTI, STI, cholecystitis, pancreatitis, gastroenteritis, nephrolithiasis, constipation, appendicitis,ovarian torsion/cyst    Urgent care workup truncated. Given abdominal pain, vomiting and constipation recommended ED evaluation.  Patient agreeable and will travel via private vehicle to [facility] ED.     Discussed MDM, treatment plan and plan for follow-up with patient who agrees with plan.    Final Clinical Impressions(s) / UC Diagnoses   Final diagnoses:  Abdominal pain, unspecified abdominal location  Nausea and vomiting, unspecified vomiting type  Constipation, unspecified constipation type     Discharge Instructions      You have belly pain with vomiting and constipation for about a week. You have been advised to follow up immediately in the emergency department for concerning signs or symptoms as discussed during your visit. I  Based on concerns about condition, if you do not follow up in the ED, you may risk poor outcomes including worsening of condition, delayed treatment and potentially life threatening issues. If you have declined to go to the ED at this time, you should call your PCP immediately to set up a follow up appointment.     ED Prescriptions   None    PDMP not reviewed this encounter.   Toba Claudio, DO 06/04/24 1027

## 2024-05-24 ENCOUNTER — Telehealth: Payer: Self-pay | Admitting: Internal Medicine

## 2024-05-24 NOTE — Telephone Encounter (Signed)
 Please review.  KP

## 2024-05-24 NOTE — Telephone Encounter (Signed)
 Copied from CRM #[id number]. Topic: Clinical - Request for Lab/Test Order >> May 24, 2024  7:34 AM Jeoffrey H wrote: Reason for CRM: Karleen a resident from [facility] is requesting an outpatient EGD is ordered due to esophagitis on the CT Scan.

## 2024-05-24 NOTE — Telephone Encounter (Signed)
 Called pt left VM to call back.  KP

## 2024-05-25 ENCOUNTER — Ambulatory Visit

## 2024-05-25 ENCOUNTER — Encounter: Payer: Self-pay | Admitting: Internal Medicine

## 2024-05-25 ENCOUNTER — Ambulatory Visit (INDEPENDENT_AMBULATORY_CARE_PROVIDER_SITE_OTHER): Admitting: Internal Medicine

## 2024-05-25 VITALS — BP 120/70 | HR 82 | Ht 65.5 in | Wt 188.0 lb

## 2024-05-25 VITALS — Ht 64.5 in | Wt 188.0 lb

## 2024-05-25 DIAGNOSIS — Z1211 Encounter for screening for malignant neoplasm of colon: Secondary | ICD-10-CM

## 2024-05-25 DIAGNOSIS — Z0000 Encounter for general adult medical examination without abnormal findings: Secondary | ICD-10-CM

## 2024-05-25 DIAGNOSIS — K2100 Gastro-esophageal reflux disease with esophagitis, without bleeding: Secondary | ICD-10-CM

## 2024-05-25 MED ORDER — PANTOPRAZOLE SODIUM 40 MG PO TBEC: 40.0000 mg | DELAYED_RELEASE_TABLET | Freq: Every day | ORAL | 0 refills | Status: DC

## 2024-05-25 NOTE — Progress Notes (Signed)
 No chief complaint on file.    Subjective:   Molly Kelly is a 68 y.o. female who presents for a Medicare Annual Wellness Visit.  Allergies (verified) Patient has no known allergies.   History: Past Medical History:  Diagnosis Date   Acid reflux    Hypercholesteremia    Hypertension    Type 2 diabetes mellitus with complication [facility])    Past Surgical History:  Procedure Laterality Date   CARPAL TUNNEL RELEASE Bilateral    right - 14 yrs ago.  left - 06/2017   COLONOSCOPY WITH PROPOFOL  N/A 04/29/2018   Procedure: COLONOSCOPY WITH BIOPSY;  Surgeon: Jinny Carmine, MD;  Location: [facility];  Service: Endoscopy;  Laterality: N/A;   POLYPECTOMY N/A 04/29/2018   Procedure: POLYPECTOMY;  Surgeon: Jinny Carmine, MD;  Location: [facility];  Service: Endoscopy;  Laterality: N/A;   Family History  Problem Relation Age of Onset   Hypertension Mother    Uterine cancer Mother    CAD Brother    Breast cancer Cousin        mat cousin   Social History   Occupational History   Not on file  Tobacco Use   Smoking status: Never   Smokeless tobacco: Never  Vaping Use   Vaping status: Never Used  Substance and Sexual Activity   Alcohol use: Yes    Comment: socially   Drug use: No   Sexual activity: Yes   Tobacco Counseling Counseling given: Not Answered  SDOH Screenings   Food Insecurity: No Food Insecurity (04/29/2023)  Housing: Unknown (05/24/2024)   Received from [facility]  Transportation Needs: No Transportation Needs (09/11/2022)   Received from [facility]  Utilities: Not At Risk (04/29/2023)  Alcohol Screen: Low Risk  (04/29/2023)  Depression (PHQ2-9): Low Risk  (05/25/2024)  Financial Resource Strain: Low Risk  (04/29/2023)  Physical Activity: Sufficiently Active (04/29/2023)  Stress: No Stress Concern Present (04/29/2023)  Tobacco Use: Low Risk  (05/25/2024)  Health Literacy: Adequate Health Literacy  (04/29/2023)   See flowsheets for full screening details  Depression Screen PHQ 2 & 9 Depression Scale- Over the past 2 weeks, how often have you been bothered by any of the following problems? Little interest or pleasure in doing things: 0 Feeling down, depressed, or hopeless (PHQ Adolescent also includes...irritable): 0 PHQ-2 Total Score: 0 Trouble falling or staying asleep, or sleeping too much: 3 Feeling tired or having little energy: 0 Poor appetite or overeating (PHQ Adolescent also includes...weight loss): 0 Feeling bad about yourself - or that you are a failure or have let yourself or your family down: 0 Trouble concentrating on things, such as reading the newspaper or watching television (PHQ Adolescent also includes...like school work): 0 Moving or speaking so slowly that other people could have noticed. Or the opposite - being so fidgety or restless that you have been moving around a lot more than usual: 0 Thoughts that you would be better off dead, or of hurting yourself in some way: 0 PHQ-9 Total Score: 3 If you checked off any problems, how difficult have these problems made it for you to do your work, take care of things at home, or get along with other people?: Not difficult at all     Goals Addressed   None    Fall Screening Falls in the past year?: 0 Number of falls in past year: 0 Was there an injury with Fall?: 0 Fall Risk Category Calculator: 0 Patient  Fall Risk Level: Low Fall Risk  Fall Risk Patient at Risk for Falls Due to: No Fall Risks Fall risk Follow up: Falls evaluation completed        Objective:    Today's Vitals   05/25/24 0912  Weight: 188 lb (85.3 kg)  Height: 5' 4.5 (1.638 m)   Body mass index is 31.77 kg/m.  Current Medications (verified) Outpatient Encounter Medications as of 05/25/2024  Medication Sig   aspirin 81 MG tablet Take 1 tablet by mouth daily.   enalapril  (VASOTEC ) 10 MG tablet Take 1 tablet (10 mg total) by mouth  daily.   ibuprofen  (ADVIL ) 600 MG tablet Take 1 tablet (600 mg total) by mouth every 6 (six) hours as needed.   meloxicam  (MOBIC ) 15 MG tablet TAKE 1 TABLET BY MOUTH DAILY (Patient taking differently: TAKE 1 TABLET BY MOUTH DAILY)   metFORMIN  (GLUCOPHAGE -XR) 500 MG 24 hr tablet Take 1 tablet (500 mg total) by mouth daily with breakfast.   rosuvastatin  (CRESTOR ) 10 MG tablet Take 1 tablet (10 mg total) by mouth daily.   Semaglutide , 1 MG/DOSE, (OZEMPIC , 1 MG/DOSE,) 4 MG/3ML SOPN Inject 1 mg into the skin once a week.   spironolactone  (ALDACTONE ) 25 MG tablet Take 1 tablet (25 mg total) by mouth daily.   VITAMIN D PO Take 1 tablet by mouth daily.   zinc gluconate 50 MG tablet Take 50 mg by mouth daily.   No facility-administered encounter medications on file as of 05/25/2024.   Hearing/Vision screen No results found. Immunizations and Health Maintenance Health Maintenance  Topic Date Due   Colonoscopy  04/30/2023   COVID-19 Vaccine (4 - 2025-26 season) 02/29/2024   Medicare Annual Wellness (AWV)  04/28/2024   Mammogram  07/07/2024   OPHTHALMOLOGY EXAM  06/02/2024   HEMOGLOBIN A1C  10/05/2024   Diabetic kidney evaluation - eGFR measurement  04/06/2025   Diabetic kidney evaluation - Urine ACR  04/06/2025   FOOT EXAM  04/06/2025   DTaP/Tdap/Td (3 - Td or Tdap) 04/24/2032   Pneumococcal Vaccine: 50+ Years  Completed   Influenza Vaccine  Completed   Bone Density Scan  Completed   Hepatitis C Screening  Completed   Zoster Vaccines- Shingrix  Completed   Meningococcal B Vaccine  Aged Out        Assessment/Plan:  This is a routine wellness examination for Molly Kelly.  Patient Care Team: Justus Leita DEL, MD as PCP - General (Internal Medicine) [facility])  I have personally reviewed and noted the following in the patient's chart:   Medical and social history Use of alcohol, tobacco or illicit drugs  Current medications and supplements including opioid  prescriptions. Functional ability and status Nutritional status Physical activity Advanced directives List of other physicians Hospitalizations, surgeries, and ER visits in previous 12 months Vitals Screenings to include cognitive, depression, and falls Referrals and appointments  No orders of the defined types were placed in this encounter.  In addition, I have reviewed and discussed with patient certain preventive protocols, quality metrics, and best practice recommendations. A written personalized care plan for preventive services as well as general preventive health recommendations were provided to patient.   Jhonnie GORMAN Das, LPN   [id number]   No follow-ups on file.  After Visit Summary: (In Person-Printed) AVS printed and given to the patient  Nurse Notes:UTD ON SHOTS; ALREADY HAS ORDER FOR MAMMOGRAM; UTD ON BDS; REFERRAL FOR COLONOSCOPY SENT

## 2024-05-25 NOTE — Progress Notes (Signed)
 Date:  05/25/2024   Name:  Molly Kelly   DOB:  07/25/1955   MRN:  [id number]   Chief Complaint: Abdominal Pain (Upper abdominal pain x 2 weeks. Patient has noticed the pain comes and goes without reason.Pain is a ache and gets tight. Patient has not taken anything OTC to try a relieve the pain. Pain can be so severe it causes nausea and vomiting.)  Gastroesophageal Reflux She complains of heartburn. She reports no abdominal pain, no chest pain, no dysphagia or no nausea. This is a recurrent problem. The problem occurs occasionally. Pertinent negatives include no fatigue. She has tried a histamine-2 antagonist for the symptoms. The treatment provided moderate relief.  She had stomach upset with vomiting after a trip to [place].  After three days she went to the ED at [facility] and had CT abdomen to rule out obstruction.  This was normal other than a verbal report of esophagitis.  She was advised to ask her PCP for a referral for an EGD.  CT abd/pelvis 05/04/24: Findings:  - Lower Thorax: No suspicious pulmonary abnormalities. No pleural or  pericardial effusions.  - Liver: Normal in morphology and enhancement.  No suspicious hepatic  masses are identified.  The portal and hepatic veins are patent.  - Biliary and Gallbladder: No intrahepatic or extrahepatic bile duct  dilatation.  - Spleen: Normal in appearance.   - Pancreas: Normal in appearance.  - Adrenal Glands: Normal in appearance.  - Kidneys: No suspicious renal lesions. No hydronephrosis.  - Abdominal and Pelvic Vasculature: No abdominal aortic aneurysm.  - Gastrointestinal Tract: No abnormal dilation or wall thickening. Normal  appendix. Small hiatal hernia.  - Peritoneum/Mesentery/Retroperitoneum: No free fluid.  No free  intraperitoneal air.  - Lymph Nodes: No retroperitoneal, mesenteric, pelvic, or inguinal  lymphadenopathy.   - Bladder: Normal in appearance.  - Pelvic Organs: Small uterine fibroid.  - Body Wall:  Unremarkable.  - Musculoskeletal:  No aggressive appearing osseous lesions.  Impression:  No bowel obstruction.   Review of Systems  Constitutional:  Negative for chills, fatigue and fever.  Respiratory:  Negative for chest tightness and shortness of breath.   Cardiovascular:  Negative for chest pain.  Gastrointestinal:  Positive for constipation and heartburn. Negative for abdominal pain, diarrhea, dysphagia, nausea and rectal pain.  Psychiatric/Behavioral:  Negative for sleep disturbance.      Lab Results  Component Value Date   NA 139 04/06/2024   K 4.2 04/06/2024   CO2 24 04/06/2024   GLUCOSE 79 04/06/2024   BUN 7 (L) 04/06/2024   CREATININE 0.65 04/06/2024   CALCIUM  10.7 (H) 04/06/2024   EGFR 97 04/06/2024   GFRNONAA >60 02/23/2024   Lab Results  Component Value Date   CHOL 203 (H) 04/06/2024   HDL 44 04/06/2024   LDLCALC 146 (H) 04/06/2024   TRIG 72 04/06/2024   CHOLHDL 4.6 (H) 04/06/2024   Lab Results  Component Value Date   TSH 0.846 04/06/2024   Lab Results  Component Value Date   HGBA1C 5.9 (H) 04/06/2024   Lab Results  Component Value Date   WBC 4.2 04/06/2024   HGB 13.9 04/06/2024   HCT 42.0 04/06/2024   MCV 97 04/06/2024   PLT 297 04/06/2024   Lab Results  Component Value Date   ALT 11 04/06/2024   AST 16 04/06/2024   ALKPHOS 73 04/06/2024   BILITOT 0.6 04/06/2024   No results found for: MARIEN BOLLS, VD25OH   Patient Active Problem  List   Diagnosis Date Noted   Arthralgia of hand 05/23/2024   Retained intrauterine contraceptive device (IUD) 08/12/2021   Primary hyperparathyroidism 08/12/2021   Type II diabetes mellitus with complication (HCC) 04/20/2019   Hyperlipidemia associated with type 2 diabetes mellitus (HCC) 08/11/2018   Benign neoplasm of transverse colon    Tubular adenoma of colon    Coronary artery disease involving native heart 05/02/2015   Gastroesophageal reflux disease 05/02/2015   Benign hypertension  05/02/2015    No Known Allergies  Past Surgical History:  Procedure Laterality Date   CARPAL TUNNEL RELEASE Bilateral    right - 14 yrs ago.  left - 06/2017   COLONOSCOPY WITH PROPOFOL  N/A 04/29/2018   Procedure: COLONOSCOPY WITH BIOPSY;  Surgeon: Jinny Carmine, MD;  Location: [facility];  Service: Endoscopy;  Laterality: N/A;   POLYPECTOMY N/A 04/29/2018   Procedure: POLYPECTOMY;  Surgeon: Jinny Carmine, MD;  Location: [facility];  Service: Endoscopy;  Laterality: N/A;    Social History   Tobacco Use   Smoking status: Never   Smokeless tobacco: Never  Vaping Use   Vaping status: Never Used  Substance Use Topics   Alcohol use: Yes    Comment: socially   Drug use: No     Medication list has been reviewed and updated.  Current Meds  Medication Sig   aspirin 81 MG tablet Take 1 tablet by mouth daily.   enalapril  (VASOTEC ) 10 MG tablet Take 1 tablet (10 mg total) by mouth daily.   ibuprofen  (ADVIL ) 600 MG tablet Take 1 tablet (600 mg total) by mouth every 6 (six) hours as needed.   meloxicam  (MOBIC ) 15 MG tablet TAKE 1 TABLET BY MOUTH DAILY (Patient taking differently: TAKE 1 TABLET BY MOUTH DAILY)   metFORMIN  (GLUCOPHAGE -XR) 500 MG 24 hr tablet Take 1 tablet (500 mg total) by mouth daily with breakfast.   ondansetron (ZOFRAN-ODT) 4 MG disintegrating tablet Take 4 mg by mouth.   pantoprazole  (PROTONIX ) 40 MG tablet Take 1 tablet (40 mg total) by mouth daily.   rosuvastatin  (CRESTOR ) 10 MG tablet Take 1 tablet (10 mg total) by mouth daily.   Semaglutide , 1 MG/DOSE, (OZEMPIC , 1 MG/DOSE,) 4 MG/3ML SOPN Inject 1 mg into the skin once a week.   spironolactone  (ALDACTONE ) 25 MG tablet Take 1 tablet (25 mg total) by mouth daily.   VITAMIN D PO Take 1 tablet by mouth daily.   zinc gluconate 50 MG tablet Take 50 mg by mouth daily.       04/06/2024    1:18 PM 09/21/2023   10:29 AM 04/29/2023    8:57 AM 09/30/2022    9:40 AM  GAD 7 : Generalized Anxiety Score   Nervous, Anxious, on Edge 0 0 0 0  Control/stop worrying 0 1 0 0  Worry too much - different things 0 1 0 0  Trouble relaxing 0 1 0 0  Restless 0 1 0 0  Easily annoyed or irritable 0 0 0 0  Afraid - awful might happen 0 1 0 0  Total GAD 7 Score 0 5 0 0  Anxiety Difficulty Not difficult at all  Not difficult at all Not difficult at all       05/25/2024    9:19 AM 05/25/2024    9:06 AM 04/06/2024    1:18 PM  Depression screen PHQ 2/9  Decreased Interest 0 0 0  Down, Depressed, Hopeless 0 0 0  PHQ - 2 Score 0 0 0  Altered sleeping 0  3  Tired, decreased energy 0  0  Change in appetite 0  0  Feeling bad or failure about yourself  0  0  Trouble concentrating 0  0  Moving slowly or fidgety/restless 0  0  Suicidal thoughts 0  0  PHQ-9 Score 0  3   Difficult doing work/chores Not difficult at all  Not difficult at all     Data saved with a previous flowsheet row definition    BP Readings from Last 3 Encounters:  05/25/24 120/70  05/23/24 (!) 153/101  04/06/24 129/76    Physical Exam Vitals and nursing note reviewed.  Constitutional:      General: She is not in acute distress.    Appearance: She is well-developed.  HENT:     Head: Normocephalic and atraumatic.  Cardiovascular:     Rate and Rhythm: Regular rhythm.  Pulmonary:     Effort: Pulmonary effort is normal. No respiratory distress.     Breath sounds: No wheezing or rhonchi.  Abdominal:     General: Abdomen is protuberant. Bowel sounds are normal.     Palpations: Abdomen is soft.     Tenderness: There is no abdominal tenderness.  Skin:    General: Skin is warm and dry.     Findings: No rash.  Neurological:     Mental Status: She is alert and oriented to person, place, and time.  Psychiatric:        Mood and Affect: Mood normal.        Behavior: Behavior normal.     Wt Readings from Last 3 Encounters:  05/25/24 188 lb (85.3 kg)  05/25/24 188 lb (85.3 kg)  05/23/24 186 lb (84.4 kg)    BP 120/70    Pulse 82   Ht 5' 5.5 (1.664 m)   Wt 188 lb (85.3 kg)   SpO2 98%   BMI 30.81 kg/m   Assessment and Plan:  Problem List Items Addressed This Visit       Unprioritized   Gastroesophageal reflux disease - Primary (Chronic)   CT yesterday by verbal report suggested esophagitis. Will change regimen to pantoprazole  daily for one month. Obtain Barium swallow to further evaluate. She is being referred for colonoscopy - might be able to add EGD if Ba swallow suggests a need      Relevant Medications   ondansetron (ZOFRAN-ODT) 4 MG disintegrating tablet   pantoprazole  (PROTONIX ) 40 MG tablet   Other Relevant Orders   DG ESOPHAGUS W SINGLE CM (SOL OR THIN BA)    No follow-ups on file.    Leita HILARIO Adie, MD [facility] [place]

## 2024-05-25 NOTE — Addendum Note (Signed)
 Addended by: JUSTUS LEITA DEL on: 05/25/2024 12:01 PM   Modules accepted: Orders

## 2024-05-25 NOTE — Patient Instructions (Addendum)
 Ms. Kiger,  Thank you for taking the time for your Medicare Wellness Visit. I appreciate your continued commitment to your health goals. Please review the care plan we discussed, and feel free to reach out if I can assist you further.  Please note that Annual Wellness Visits do not include a physical exam. Some assessments may be limited, especially if the visit was conducted virtually. If needed, we may recommend an in-person follow-up with your provider.  Ongoing Care Seeing your primary care provider every 3 to 6 months helps us  monitor your health and provide consistent, personalized care.   Referrals If a referral was made during today's visit and you haven't received any updates within two weeks, please contact the referred provider directly to check on the status.  REFERRAL FOR COLONOSCOPY SENT   Recommended Screenings:  Health Maintenance  Topic Date Due   Colon Cancer Screening  04/30/2023   COVID-19 Vaccine (4 - 2025-26 season) 02/29/2024   Breast Cancer Screening  07/07/2024   Eye exam for diabetics  06/02/2024   Hemoglobin A1C  10/05/2024   Yearly kidney function blood test for diabetes  04/06/2025   Yearly kidney health urinalysis for diabetes  04/06/2025   Complete foot exam   04/06/2025   Medicare Annual Wellness Visit  05/25/2025   Osteoporosis screening with Bone Density Scan  07/31/2027   DTaP/Tdap/Td vaccine (3 - Td or Tdap) 04/24/2032   Pneumococcal Vaccine for age over 18  Completed   Flu Shot  Completed   Hepatitis C Screening  Completed   Zoster (Shingles) Vaccine  Completed   Meningitis B Vaccine  Aged Out    Vision: Annual vision screenings are recommended for early detection of glaucoma, cataracts, and diabetic retinopathy. These exams can also reveal signs of chronic conditions such as diabetes and high blood pressure.  Dental: Annual dental screenings help detect early signs of oral cancer, gum disease, and other conditions linked to overall health,  including heart disease and diabetes.  Please see the attached documents for additional preventive care recommendations.   NEXT AWV 06/08/25 @ 1:10 PM IN PERSON

## 2024-05-25 NOTE — Patient Instructions (Signed)
 Call [facility] to schedule your mammogram at [phone].

## 2024-05-25 NOTE — Assessment & Plan Note (Signed)
 CT yesterday by verbal report suggested esophagitis. Will change regimen to pantoprazole  daily for one month. Obtain Barium swallow to further evaluate. She is being referred for colonoscopy - might be able to add EGD if Ba swallow suggests a need

## 2024-06-01 ENCOUNTER — Other Ambulatory Visit: Payer: Self-pay | Admitting: Internal Medicine

## 2024-06-01 ENCOUNTER — Ambulatory Visit: Payer: Self-pay | Admitting: Internal Medicine

## 2024-06-01 ENCOUNTER — Encounter: Payer: Self-pay | Admitting: Internal Medicine

## 2024-06-01 ENCOUNTER — Telehealth: Payer: Self-pay | Admitting: Internal Medicine

## 2024-06-01 ENCOUNTER — Telehealth: Payer: Self-pay

## 2024-06-01 ENCOUNTER — Ambulatory Visit
Admission: RE | Admit: 2024-06-01 | Discharge: 2024-06-01 | Disposition: A | Source: Ambulatory Visit | Attending: Internal Medicine | Admitting: Internal Medicine

## 2024-06-01 DIAGNOSIS — K222 Esophageal obstruction: Secondary | ICD-10-CM | POA: Insufficient documentation

## 2024-06-01 DIAGNOSIS — K2100 Gastro-esophageal reflux disease with esophagitis, without bleeding: Secondary | ICD-10-CM | POA: Insufficient documentation

## 2024-06-01 NOTE — Progress Notes (Unsigned)
 Date:  06/01/2024   Name:  Molly Kelly   DOB:  Sep 28, 1955   MRN:  [id number]   Chief Complaint: No chief complaint on file.  HPI  Review of Systems   Lab Results  Component Value Date   NA 139 04/06/2024   K 4.2 04/06/2024   CO2 24 04/06/2024   GLUCOSE 79 04/06/2024   BUN 7 (L) 04/06/2024   CREATININE 0.65 04/06/2024   CALCIUM  10.7 (H) 04/06/2024   EGFR 97 04/06/2024   GFRNONAA >60 02/23/2024   Lab Results  Component Value Date   CHOL 203 (H) 04/06/2024   HDL 44 04/06/2024   LDLCALC 146 (H) 04/06/2024   TRIG 72 04/06/2024   CHOLHDL 4.6 (H) 04/06/2024   Lab Results  Component Value Date   TSH 0.846 04/06/2024   Lab Results  Component Value Date   HGBA1C 5.9 (H) 04/06/2024   Lab Results  Component Value Date   WBC 4.2 04/06/2024   HGB 13.9 04/06/2024   HCT 42.0 04/06/2024   MCV 97 04/06/2024   PLT 297 04/06/2024   Lab Results  Component Value Date   ALT 11 04/06/2024   AST 16 04/06/2024   ALKPHOS 73 04/06/2024   BILITOT 0.6 04/06/2024   No results found for: MARIEN BOLLS, VD25OH   Patient Active Problem List   Diagnosis Date Noted   Arthralgia of hand 05/23/2024   Retained intrauterine contraceptive device (IUD) 08/12/2021   Primary hyperparathyroidism 08/12/2021   Type II diabetes mellitus with complication (HCC) 04/20/2019   Hyperlipidemia associated with type 2 diabetes mellitus (HCC) 08/11/2018   Benign neoplasm of transverse colon    Tubular adenoma of colon    Coronary artery disease involving native heart 05/02/2015   Gastroesophageal reflux disease 05/02/2015   Benign hypertension 05/02/2015    No Known Allergies  Past Surgical History:  Procedure Laterality Date   CARPAL TUNNEL RELEASE Bilateral    right - 14 yrs ago.  left - 06/2017   COLONOSCOPY WITH PROPOFOL  N/A 04/29/2018   Procedure: COLONOSCOPY WITH BIOPSY;  Surgeon: Jinny Carmine, MD;  Location: [facility];  Service: Endoscopy;  Laterality: N/A;    POLYPECTOMY N/A 04/29/2018   Procedure: POLYPECTOMY;  Surgeon: Jinny Carmine, MD;  Location: [facility];  Service: Endoscopy;  Laterality: N/A;    Social History   Tobacco Use   Smoking status: Never   Smokeless tobacco: Never  Vaping Use   Vaping status: Never Used  Substance Use Topics   Alcohol use: Yes    Comment: socially   Drug use: No     Medication list has been reviewed and updated.  No outpatient medications have been marked as taking for the 06/01/24 encounter (Orders Only) with Justus Leita DEL, MD.       04/06/2024    1:18 PM 09/21/2023   10:29 AM 04/29/2023    8:57 AM 09/30/2022    9:40 AM  GAD 7 : Generalized Anxiety Score  Nervous, Anxious, on Edge 0 0 0 0  Control/stop worrying 0 1 0 0  Worry too much - different things 0 1 0 0  Trouble relaxing 0 1 0 0  Restless 0 1 0 0  Easily annoyed or irritable 0 0 0 0  Afraid - awful might happen 0 1 0 0  Total GAD 7 Score 0 5 0 0  Anxiety Difficulty Not difficult at all  Not difficult at all Not difficult at all  05/25/2024    9:19 AM 05/25/2024    9:06 AM 04/06/2024    1:18 PM  Depression screen PHQ 2/9  Decreased Interest 0 0 0  Down, Depressed, Hopeless 0 0 0  PHQ - 2 Score 0 0 0  Altered sleeping 0  3  Tired, decreased energy 0  0  Change in appetite 0  0  Feeling bad or failure about yourself  0  0  Trouble concentrating 0  0  Moving slowly or fidgety/restless 0  0  Suicidal thoughts 0  0  PHQ-9 Score 0  3   Difficult doing work/chores Not difficult at all  Not difficult at all     Data saved with a previous flowsheet row definition    BP Readings from Last 3 Encounters:  05/25/24 120/70  05/23/24 (!) 153/101  04/06/24 129/76    Physical Exam  Wt Readings from Last 3 Encounters:  05/25/24 188 lb (85.3 kg)  05/25/24 188 lb (85.3 kg)  05/23/24 186 lb (84.4 kg)    There were no vitals taken for this visit.  Assessment and Plan:  Problem List Items Addressed This  Visit   None   No follow-ups on file.    Leita HILARIO Adie, MD [facility] [place]

## 2024-06-01 NOTE — Telephone Encounter (Signed)
 Copied from CRM #[id number]. Topic: General - Other >> Jun 01, 2024 11:59 AM Hadassah PARAS wrote: Reason for CRM: Pt has an order for mammogram but since PCP is retiring beginning of next year, a new referall needs to be sent in my new PCP. Pt is unsure of the name of the dr she would like to be transferred to. Dr. Justus recommended a specific doctor and she would like to go w them. Please check w PCP to see which doctor she recommended. Advise pt on #[phone]

## 2024-06-01 NOTE — Telephone Encounter (Signed)
 Patient called stating that she needed to schedule a colonoscopy. However, her referral stated esophageal stricture.  Looking further in her chart, she is also due for a colonoscopy as her last one was done on 04/29/2018 by Dr. Jinny. She was due to repeat on/after 04/30/2023. Please schedule patient an appointment so she could be seen by a provider to have an EGD and Colonoscopy.  Thank you.

## 2024-06-02 NOTE — Telephone Encounter (Signed)
 Notified patient with message sent.

## 2024-06-14 ENCOUNTER — Encounter: Payer: Self-pay | Admitting: Gastroenterology

## 2024-06-14 ENCOUNTER — Ambulatory Visit (INDEPENDENT_AMBULATORY_CARE_PROVIDER_SITE_OTHER): Admitting: Gastroenterology

## 2024-06-14 VITALS — BP 135/81 | HR 83 | Temp 98.2°F | Wt 193.6 lb

## 2024-06-14 DIAGNOSIS — K222 Esophageal obstruction: Secondary | ICD-10-CM | POA: Diagnosis not present

## 2024-06-14 DIAGNOSIS — Z860100 Personal history of colon polyps, unspecified: Secondary | ICD-10-CM

## 2024-06-14 DIAGNOSIS — Z860101 Personal history of adenomatous and serrated colon polyps: Secondary | ICD-10-CM | POA: Diagnosis not present

## 2024-06-14 DIAGNOSIS — R933 Abnormal findings on diagnostic imaging of other parts of digestive tract: Secondary | ICD-10-CM

## 2024-06-14 NOTE — Addendum Note (Signed)
 Addended by: LANNIE ANDREA GRADE on: 06/14/2024 11:49 AM   Modules accepted: Orders

## 2024-06-14 NOTE — Progress Notes (Signed)
 Gastroenterology Consultation  Referring Provider:     Justus Leita DEL, MD Primary Care Physician:  Justus Leita DEL, MD Primary Gastroenterologist:  Dr. Melany     Reason for Consultation:     Abnormal barium swallow        HPI:   Molly Kelly is a 68 y.o. y/o female referred for consultation & management of abnormal barium swallow by Dr. Justus, Leita DEL, MD.   Molly Kelly developed epigastric pain and reported to the urgent care for evaluation.  She was sent to [facility] ER for a CT scan, which showed only a small hiatal hernia.  She was constipated, determined to be the cause of her epigastric pain.  She was seen by her PCP and started on PPI for acid reflux 3 to 4 weeks ago.  That has resolved the issue.  She was also sent for barium swallow to further evaluate. It showed a distal esophageal stricture impeding passage of a 13 mm barium tablet and mild to moderate esophageal dysmotility with tertiary contractions, as well as the known small sliding hiatal hernia. She does not have issues with dysphagia.  Thanksgiving recently went just fine.  She does not take NSAIDs.  An aunt had ulcers and a sister had reflux symptoms which may have been attributable to MI in retrospect.  She also has a history of colon polyp, last colonoscopy performed in 2019.  She is due for repeated colonoscopy now.  Past Medical History:  Diagnosis Date   Acid reflux    Hypercholesteremia    Hypertension    Type 2 diabetes mellitus with complication [facility])     Past Surgical History:  Procedure Laterality Date   CARPAL TUNNEL RELEASE Bilateral    right - 14 yrs ago.  left - 06/2017   COLONOSCOPY WITH PROPOFOL  N/A 04/29/2018   Procedure: COLONOSCOPY WITH BIOPSY;  Surgeon: Jinny Carmine, MD;  Location: [facility];  Service: Endoscopy;  Laterality: N/A;   POLYPECTOMY N/A 04/29/2018   Procedure: POLYPECTOMY;  Surgeon: Jinny Carmine, MD;  Location: [facility];  Service: Endoscopy;   Laterality: N/A;    Prior to Admission medications  Medication Sig Start Date End Date Taking? Authorizing Provider  aspirin 81 MG tablet Take 1 tablet by mouth daily.   Yes [provider]  enalapril  (VASOTEC ) 10 MG tablet Take 1 tablet (10 mg total) by mouth daily. 04/06/24  Yes Justus Leita DEL, MD  ibuprofen  (ADVIL ) 600 MG tablet Take 1 tablet (600 mg total) by mouth every 6 (six) hours as needed. 02/23/24  Yes Van Knee, MD  meloxicam  (MOBIC ) 15 MG tablet TAKE 1 TABLET BY MOUTH DAILY 01/05/24  Yes Berglund, Laura H, MD  metFORMIN  (GLUCOPHAGE -XR) 500 MG 24 hr tablet Take 1 tablet (500 mg total) by mouth daily with breakfast. 04/06/24  Yes Justus Leita DEL, MD  pantoprazole  (PROTONIX ) 40 MG tablet Take 1 tablet (40 mg total) by mouth daily. 05/25/24  Yes Justus Leita DEL, MD  rosuvastatin  (CRESTOR ) 10 MG tablet Take 1 tablet (10 mg total) by mouth daily. 04/06/24  Yes Justus Leita DEL, MD  Semaglutide , 1 MG/DOSE, (OZEMPIC , 1 MG/DOSE,) 4 MG/3ML SOPN Inject 1 mg into the skin once a week. 04/06/24  Yes Justus Leita DEL, MD  spironolactone  (ALDACTONE ) 25 MG tablet Take 1 tablet (25 mg total) by mouth daily. 04/06/24  Yes Justus Leita DEL, MD  VITAMIN D PO Take 1 tablet by mouth daily.   Yes [provider]  zinc gluconate 50 MG tablet Take 50 mg by mouth daily.   Yes [provider]    Family History  Problem Relation Age of Onset   Hypertension Mother    Uterine cancer Mother    CAD Brother    Breast cancer Cousin        mat cousin     Social History[1]  Allergies as of 06/14/2024   (No Known Allergies)    Review of Systems:    All systems reviewed and negative except where noted in HPI.   Physical Exam:  BP 135/81 (BP Location: Right Arm, Patient Position: Sitting, Cuff Size: Large)   Pulse 83   Temp 98.2 F (36.8 C) (Oral)   Wt 193 lb 9.6 oz (87.8 kg)   BMI 32.72 kg/m  No LMP recorded. Patient is postmenopausal. General:   Alert,   Well-developed, well-nourished, pleasant and cooperative in NAD Head:  Normocephalic and atraumatic. Eyes:  Sclera clear, no icterus.   Conjunctiva pink. Ears:  Normal auditory acuity. Neck:  Supple; no masses or thyromegaly. Lungs:  Respirations even and unlabored.  Clear throughout to auscultation.   No wheezes, crackles, or rhonchi. No acute distress. Heart:  Regular rate and rhythm; no murmurs, clicks, rubs, or gallops. Abdomen:  Normal bowel sounds.  No bruits.  Soft, non-tender and non-distended without masses, hepatosplenomegaly or hernias noted.  No guarding or rebound tenderness.  Negative Carnett sign.   Rectal:  Deferred.  Pulses:  Normal pulses noted. Extremities:  No clubbing or edema.  No cyanosis. Neurologic:  Alert and oriented x3;  grossly normal neurologically. Skin:  Intact without significant lesions or rashes.  No jaundice. Lymph Nodes:  No significant cervical adenopathy. Psych:  Alert and cooperative. Normal mood and affect.  Imaging Studies: DG ESOPHAGUS W DOUBLE CM (HD) Result Date: 06/01/2024 CLINICAL DATA:  Patient with a history of intermittent upper abdominal pain. Small hiatal hernia seen on recent CT abdomen pelvis 05/04/2024. Concern for possible GERD/esophagitis. EXAM: ESOPHAGUS/BARIUM SWALLOW/TABLET STUDY TECHNIQUE: Combined double and single contrast examination was performed using effervescent crystals, high-density barium, and thin liquid barium. This exam was performed by Warren Dais, NP, and was supervised and interpreted by Dr. Luverne. FLUOROSCOPY: Radiation Exposure Index (as provided by the fluoroscopic device): 63 mGy Kerma COMPARISON:  None Available. FINDINGS: Swallowing: Appears normal. No vestibular penetration or aspiration seen. Pharynx: Unremarkable. Esophagus: Prominent aortic notch. Moderate distal esophageal stricture above the GE junction. Esophageal motility: Mild to moderate dysmotility with tertiary contractions. Hiatal Hernia: Small  sliding hiatal hernia. Gastroesophageal reflux: None visualized during the exam. Ingested 13mm barium tablet: Became stuck at the distal esophageal stricture. Other: None. IMPRESSION: Distal esophageal stricture impeding passage of a 13 mm barium tablet. Mild to moderate esophageal dysmotility with tertiary contractions. Small sliding hiatal hernia. Electronically Signed   By: Marcey Luverne M.D.   On: 06/01/2024 12:14    Assessment and Plan:   Molly Kelly is a 68 y.o. y/o female with history of adenomatous colon polyp in the transverse in 2019, acid reflux, small hiatal hernia, and distal esophageal stricture on barium swallow.  Plan EGD to evaluate the stricture and ensure that it is benign.  Since she is not having dysphagia, I do not plan to dilate the stricture.  This was explained to her, and she was advised that it is possible that she could develop dysphagia in the future, at which time we would need to stretch the stricture.   Will also proceed to colonoscopy  at the same time.    [name], MD   Note: This dictation was prepared with Dragon dictation along with smaller phrase technology. Any transcriptional errors that result from this process are unintentional.       [1]  Social History Tobacco Use   Smoking status: Never   Smokeless tobacco: Never  Vaping Use   Vaping status: Never Used  Substance Use Topics   Alcohol use: Yes    Comment: socially   Drug use: No

## 2024-06-16 ENCOUNTER — Other Ambulatory Visit: Payer: Self-pay | Admitting: Internal Medicine

## 2024-06-16 DIAGNOSIS — K2100 Gastro-esophageal reflux disease with esophagitis, without bleeding: Secondary | ICD-10-CM

## 2024-06-20 NOTE — Telephone Encounter (Signed)
 Requested medication (s) are due for refill today: Yes  Requested medication (s) are on the active medication list: Yes  Last refill:  05/25/24  Future visit scheduled: Yes  Notes to clinic:  See pharmacy request.    Requested Prescriptions  Pending Prescriptions Disp Refills   pantoprazole  (PROTONIX ) 40 MG tablet [Pharmacy Med Name: PANTOPRAZOLE  SOD DR 40 MG TAB] 90 tablet 1    Sig: TAKE 1 TABLET BY MOUTH EVERY DAY     Gastroenterology: Proton Pump Inhibitors Passed - 06/20/2024  2:25 PM      Passed - Valid encounter within last 12 months    Recent Outpatient Visits           3 weeks ago Gastroesophageal reflux disease with esophagitis without hemorrhage   [facility], Leita DEL, MD   2 months ago Benign hypertension   [place] [facility], Leita DEL, MD   9 months ago Type II diabetes mellitus with complication [facility])   [place] [facility] at [facility], Leita DEL, MD

## 2024-07-21 ENCOUNTER — Encounter: Payer: Self-pay | Admitting: Gastroenterology

## 2024-07-21 MED ORDER — NA SULFATE-K SULFATE-MG SULF 17.5-3.13-1.6 GM/177ML PO SOLN: 1.0000 | Freq: Once | ORAL | 0 refills | Status: AC

## 2024-07-21 NOTE — Addendum Note (Signed)
 Addended by: LANNIE BRAULIO GRADE on: 07/21/2024 12:46 PM   Modules accepted: Orders

## 2024-07-27 ENCOUNTER — Encounter: Payer: Self-pay | Admitting: Gastroenterology

## 2024-07-27 ENCOUNTER — Ambulatory Visit: Payer: Self-pay | Admitting: Anesthesiology

## 2024-07-27 ENCOUNTER — Other Ambulatory Visit: Payer: Self-pay

## 2024-07-27 ENCOUNTER — Encounter: Admission: RE | Payer: Self-pay

## 2024-07-27 ENCOUNTER — Ambulatory Visit
Admission: RE | Admit: 2024-07-27 | Discharge: 2024-07-27 | Disposition: A | Discharge status: Home / Self Care | Attending: Gastroenterology | Admitting: Gastroenterology

## 2024-07-27 DIAGNOSIS — K449 Diaphragmatic hernia without obstruction or gangrene: Secondary | ICD-10-CM | POA: Diagnosis not present

## 2024-07-27 DIAGNOSIS — K635 Polyp of colon: Secondary | ICD-10-CM | POA: Diagnosis not present

## 2024-07-27 DIAGNOSIS — E118 Type 2 diabetes mellitus with unspecified complications: Secondary | ICD-10-CM

## 2024-07-27 DIAGNOSIS — R933 Abnormal findings on diagnostic imaging of other parts of digestive tract: Secondary | ICD-10-CM | POA: Insufficient documentation

## 2024-07-27 DIAGNOSIS — Z860101 Personal history of adenomatous and serrated colon polyps: Secondary | ICD-10-CM

## 2024-07-27 DIAGNOSIS — K640 First degree hemorrhoids: Secondary | ICD-10-CM | POA: Insufficient documentation

## 2024-07-27 DIAGNOSIS — E119 Type 2 diabetes mellitus without complications: Secondary | ICD-10-CM | POA: Diagnosis not present

## 2024-07-27 DIAGNOSIS — K644 Residual hemorrhoidal skin tags: Secondary | ICD-10-CM | POA: Diagnosis not present

## 2024-07-27 DIAGNOSIS — I10 Essential (primary) hypertension: Secondary | ICD-10-CM | POA: Insufficient documentation

## 2024-07-27 DIAGNOSIS — Z1211 Encounter for screening for malignant neoplasm of colon: Secondary | ICD-10-CM | POA: Diagnosis present

## 2024-07-27 DIAGNOSIS — Z7984 Long term (current) use of oral hypoglycemic drugs: Secondary | ICD-10-CM | POA: Diagnosis not present

## 2024-07-27 DIAGNOSIS — K222 Esophageal obstruction: Secondary | ICD-10-CM | POA: Diagnosis not present

## 2024-07-27 DIAGNOSIS — D123 Benign neoplasm of transverse colon: Secondary | ICD-10-CM | POA: Diagnosis not present

## 2024-07-27 DIAGNOSIS — Z860100 Personal history of colon polyps, unspecified: Secondary | ICD-10-CM

## 2024-07-27 DIAGNOSIS — Z7985 Long-term (current) use of injectable non-insulin antidiabetic drugs: Secondary | ICD-10-CM | POA: Diagnosis not present

## 2024-07-27 LAB — GLUCOSE, CAPILLARY: Glucose-Capillary: 82 mg/dL (ref 70–99)

## 2024-07-27 MED ORDER — LIDOCAINE HCL (CARDIAC) PF 100 MG/5ML IV SOSY
PREFILLED_SYRINGE | INTRAVENOUS | Status: DC | PRN
  Administered 2024-07-27: 50 mg via INTRAVENOUS

## 2024-07-27 MED ORDER — GLYCOPYRROLATE 0.2 MG/ML IJ SOLN
INTRAMUSCULAR | Status: DC | PRN
  Administered 2024-07-27: .1 mg via INTRAVENOUS

## 2024-07-27 MED ORDER — EPHEDRINE SULFATE (PRESSORS) 25 MG/5ML IV SOSY
PREFILLED_SYRINGE | INTRAVENOUS | Status: DC | PRN
  Administered 2024-07-27: 5 mg via INTRAVENOUS

## 2024-07-27 MED ORDER — PHENYLEPHRINE HCL 10 % OP SOLN
OPHTHALMIC | Status: AC
  Filled 2024-07-27: qty 5

## 2024-07-27 MED ORDER — STERILE WATER FOR IRRIGATION IR SOLN
Status: DC | PRN
  Administered 2024-07-27: 1

## 2024-07-27 MED ORDER — SODIUM CHLORIDE 0.9 % IV SOLN: INTRAVENOUS | Status: DC

## 2024-07-27 MED ORDER — PROPOFOL 10 MG/ML IV BOLUS
INTRAVENOUS | Status: DC | PRN
  Administered 2024-07-27: 30 mg via INTRAVENOUS
  Administered 2024-07-27 (×2): 20 mg via INTRAVENOUS
  Administered 2024-07-27: 30 mg via INTRAVENOUS
  Administered 2024-07-27 (×2): 20 mg via INTRAVENOUS
  Administered 2024-07-27: 30 mg via INTRAVENOUS
  Administered 2024-07-27 (×2): 20 mg via INTRAVENOUS
  Administered 2024-07-27: 70 mg via INTRAVENOUS
  Administered 2024-07-27 (×2): 30 mg via INTRAVENOUS
  Administered 2024-07-27: 20 mg via INTRAVENOUS
  Administered 2024-07-27 (×5): 30 mg via INTRAVENOUS

## 2024-07-27 MED ORDER — CYCLOPENTOLATE HCL 2 % OP SOLN
OPHTHALMIC | Status: AC
  Filled 2024-07-27: qty 2

## 2024-07-27 MED ORDER — PROPOFOL 10 MG/ML IV BOLUS
INTRAVENOUS | Status: AC
  Filled 2024-07-27: qty 20

## 2024-07-27 MED ORDER — PROPOFOL 1000 MG/100ML IV EMUL
INTRAVENOUS | Status: AC
  Filled 2024-07-27: qty 100

## 2024-07-27 MED ORDER — LACTATED RINGERS IV SOLN: INTRAVENOUS | Status: DC

## 2024-07-27 MED ORDER — TETRACAINE HCL 0.5 % OP SOLN
OPHTHALMIC | Status: AC
  Filled 2024-07-27: qty 4

## 2024-07-27 NOTE — Op Note (Signed)
 [facility] Patient Name: Molly Kelly Procedure Date: 07/27/2024 8:52 AM MRN: [id number] Account #: [id number] Date of Birth: [birth year] Admit Type: Outpatient Age: 69 Room: [facility] OR ROOM 01 Gender: Female Note Status: Finalized Instrument Name: Colonoscope [id number] Procedure:             Colonoscopy Indications:           High risk colon cancer surveillance: Personal history                         of colonic polyps Providers:             [name], MD Referring MD:          Toribio SQUIBB. Waddell (Referring MD) Medicines:             Propofol  per Anesthesia Complications:         No immediate complications. Procedure:             Pre-Anesthesia Assessment:                        - Prior to the procedure, a History and Physical was                         performed, and patient medications and allergies were                         reviewed. The patient's tolerance of previous                         anesthesia was also reviewed. The risks and benefits                         of the procedure and the sedation options and risks                         were discussed with the patient. All questions were                         answered, and informed consent was obtained. Prior                         Anticoagulants: The patient has taken no anticoagulant                         or antiplatelet agents. ASA Grade Assessment: III - A                         patient with severe systemic disease. After reviewing                         the risks and benefits, the patient was deemed in                         satisfactory condition to undergo the procedure.                        After obtaining informed consent, the colonoscope was  passed under direct vision. Throughout the procedure,                         the patient's blood pressure, pulse, and oxygen                         saturations were monitored continuously. The                          Colonoscope was introduced through the anus and                         advanced to the the cecum, identified by appendiceal                         orifice and ileocecal valve. The ileocecal valve,                         appendiceal orifice, and rectum were photographed. Findings:      A 3 mm polyp was found in the transverse colon. The polyp was sessile.       The polyp was removed with a cold snare. Resection and retrieval were       complete. Estimated blood loss: none.      External and internal hemorrhoids were found. The hemorrhoids were Grade       I (internal hemorrhoids that do not prolapse). Impression:            - One 3 mm polyp in the transverse colon, removed with                         a cold snare. Resected and retrieved.                        - External and internal hemorrhoids. Recommendation:        - Patient has a contact number available for                         emergencies. The signs and symptoms of potential                         delayed complications were discussed with the patient.                         Return to normal activities tomorrow. Written                         discharge instructions were provided to the patient.                        - High fiber diet.                        - Continue present medications.                        - Await pathology results.                        - Repeat colonoscopy in 5-10 years for  surveillance                         based on pathology results.                        - The findings and recommendations were discussed with                         the designated responsible adult. Procedure Code(s):     --- Professional ---                        [id number], Colonoscopy, flexible; with removal of                         tumor(s), polyp(s), or other lesion(s) by snare                         technique Diagnosis Code(s):     --- Professional ---                        Z86.010, Personal history  of colonic polyps                        D12.3, Benign neoplasm of transverse colon (hepatic                         flexure or splenic flexure)                        K64.0, First degree hemorrhoids CPT copyright 2022 American Medical Association. All rights reserved. The codes documented in this report are preliminary and upon coder review may  be revised to meet current compliance requirements. [name], MD 07/27/2024 9:33:53 AM Number of Addenda: 0 Note Initiated On: 07/27/2024 8:52 AM Scope Withdrawal Time: 0 hours 8 minutes 4 seconds  Total Procedure Duration: 0 hours 12 minutes 9 seconds  Estimated Blood Loss:  Estimated blood loss: none.      [facility]

## 2024-07-27 NOTE — Anesthesia Postprocedure Evaluation (Signed)
"   Anesthesia Post Note  Patient: Molly Kelly  Procedure(s) Performed: COLONOSCOPY EGD (ESOPHAGOGASTRODUODENOSCOPY) POLYPECTOMY, INTESTINE (Rectum)  Patient location during evaluation: Endoscopy Anesthesia Type: General Level of consciousness: awake and alert Pain management: pain level controlled Vital Signs Assessment: post-procedure vital signs reviewed and stable Respiratory status: spontaneous breathing, nonlabored ventilation, respiratory function stable and patient connected to nasal cannula oxygen Cardiovascular status: blood pressure returned to baseline and stable Postop Assessment: no apparent nausea or vomiting Anesthetic complications: no   No notable events documented.   Last Vitals:  Vitals:   07/27/24 0948 07/27/24 0950  BP: (!) 86/70 124/78  Pulse: 88 86  Resp: 17 18  Temp:    SpO2: 100% 100%    Last Pain:  Vitals:   07/27/24 0820  TempSrc: Temporal  PainSc: 0-No pain                 Fairy A Maryjayne Kleven      "

## 2024-07-27 NOTE — Anesthesia Preprocedure Evaluation (Signed)
"                                    Anesthesia Evaluation  Patient identified by MRN, date of birth, ID band Patient awake    Reviewed: Allergy & Precautions, H&P , NPO status , Patient's Chart, lab work & pertinent test results  Airway Mallampati: II  TM Distance: >3 FB Neck ROM: Full    Dental no notable dental hx.    Pulmonary neg pulmonary ROS   Pulmonary exam normal breath sounds clear to auscultation       Cardiovascular hypertension, negative cardio ROS Normal cardiovascular exam Rhythm:Regular Rate:Normal     Neuro/Psych negative neurological ROS  negative psych ROS   GI/Hepatic negative GI ROS, Neg liver ROS,,,  Endo/Other  negative endocrine ROSdiabetes    Renal/GU negative Renal ROS  negative genitourinary   Musculoskeletal negative musculoskeletal ROS (+)    Abdominal   Peds negative pediatric ROS (+)  Hematology negative hematology ROS (+)   Anesthesia Other Findings   Reproductive/Obstetrics negative OB ROS                              Anesthesia Physical Anesthesia Plan  ASA: 3  Anesthesia Plan: General   Post-op Pain Management:    Induction: Intravenous  PONV Risk Score and Plan:   Airway Management Planned:   Additional Equipment:   Intra-op Plan:   Post-operative Plan: Extubation in OR  Informed Consent: I have reviewed the patients History and Physical, chart, labs and discussed the procedure including the risks, benefits and alternatives for the proposed anesthesia with the patient or authorized representative who has indicated his/her understanding and acceptance.     Dental advisory given  Plan Discussed with: CRNA  Anesthesia Plan Comments:         Anesthesia Quick Evaluation  "

## 2024-07-27 NOTE — Transfer of Care (Signed)
 Immediate Anesthesia Transfer of Care Note  Patient: Molly Kelly  Procedure(s) Performed: COLONOSCOPY EGD (ESOPHAGOGASTRODUODENOSCOPY) POLYPECTOMY, INTESTINE (Rectum)  Patient Location: PACU  Anesthesia Type: General  Level of Consciousness: awake, alert  and patient cooperative  Airway and Oxygen Therapy: Patient Spontanous Breathing and Patient connected to supplemental oxygen  Post-op Assessment: Post-op Vital signs reviewed, Patient's Cardiovascular Status Stable, Respiratory Function Stable, Patent Airway and No signs of Nausea or vomiting  Post-op Vital Signs: Reviewed and stable  Complications: No notable events documented.

## 2024-07-27 NOTE — Op Note (Signed)
 [facility]astroenter[facility] Patient Name: Molly Kelly Procedure Date: 07/27/2024 8:53 AM MRN: [id number] Account #: [id number] Date of Birth: June 06, 1956 Admit Type: Outpatient Age: 69 Room: [facility] OR ROOM 01 Gender: Female Note Status: Finalized Instrument Name: Endoscope [id number] Procedure:             Upper GI endoscopy Indications:           Endoscopy to confirm esophageal stricture that was                         demonstrated on previous imaging study Providers:             [name], MD Referring MD:          Toribio SQUIBB. Waddell (Referring MD) Medicines:             Propofol  per Anesthesia Complications:         No immediate complications. Procedure:             Pre-Anesthesia Assessment:                        - Prior to the procedure, a History and Physical was                         performed, and patient medications and allergies were                         reviewed. The patient's tolerance of previous                         anesthesia was also reviewed. The risks and benefits                         of the procedure and the sedation options and risks                         were discussed with the patient. All questions were                         answered, and informed consent was obtained. Prior                         Anticoagulants: The patient has taken no anticoagulant                         or antiplatelet agents. ASA Grade Assessment: III - A                         patient with severe systemic disease. After reviewing                         the risks and benefits, the patient was deemed in                         satisfactory condition to undergo the procedure.                        After obtaining informed consent, the endoscope was  passed under direct vision. Throughout the procedure,                         the patient's blood pressure, pulse, and oxygen                         saturations were monitored  continuously. The Endoscope                         was introduced through the mouth, and advanced to the                         third part of duodenum. The upper GI endoscopy was                         accomplished without difficulty. The patient tolerated                         the procedure well. Findings:      A widely patent Schatzki ring was found at the gastroesophageal junction.      A 2 cm hiatal hernia was present.      The examined duodenum was normal. Impression:            - Widely patent Schatzki ring.                        - 2 cm hiatal hernia.                        - Normal examined duodenum.                        - No specimens collected. Recommendation:        - Patient has a contact number available for                         emergencies. The signs and symptoms of potential                         delayed complications were discussed with the patient.                         Return to normal activities tomorrow. Written                         discharge instructions were provided to the patient.                        - Resume previous diet.                        - Continue present medications.                        - The findings and recommendations were discussed with                         the designated responsible adult. Procedure Code(s):     --- Professional ---  56764, Esophagogastroduodenoscopy, flexible,                         transoral; diagnostic, including collection of                         specimen(s) by brushing or washing, when performed                         (separate procedure) Diagnosis Code(s):     --- Professional ---                        K22.2, Esophageal obstruction                        K44.9, Diaphragmatic hernia without obstruction or                         gangrene                        R93.3, Abnormal findings on diagnostic imaging of                         other parts of digestive tract CPT  copyright 2022 American Medical Association. All rights reserved. The codes documented in this report are preliminary and upon coder review may  be revised to meet current compliance requirements. [name], MD 07/27/2024 9:14:10 AM Number of Addenda: 0 Note Initiated On: 07/27/2024 8:53 AM Estimated Blood Loss:  Estimated blood loss: none.      [facility]

## 2024-07-27 NOTE — H&P (Signed)
 "  Molly Schaffer, MD [facility] [address] [place], [place] [address] Phone:[phone] Fax : [phone]  Primary Care Physician:  Manya Toribio SQUIBB, PA Primary Gastroenterologist:  Dr. Schaffer  Pre-Procedure History & Physical: HPI:  Molly Kelly is a 69 y.o. female is here for an EGD as evaluation of an abnormal Ba swallow (no dysphagia) and colonoscopy as evaluation of an AP in the transverse in 2019. See progress note dated 06/14/24.  Fhx CRC? No Blood thinners? No   Past Medical History:  Diagnosis Date   Acid reflux    Hypercholesteremia    Hypertension    Type 2 diabetes mellitus with complication [facility])     Past Surgical History:  Procedure Laterality Date   CARPAL TUNNEL RELEASE Bilateral    right - 14 yrs ago.  left - 06/2017   COLONOSCOPY WITH PROPOFOL  N/A 04/29/2018   Procedure: COLONOSCOPY WITH BIOPSY;  Surgeon: Jinny Carmine, MD;  Location: [facility];  Service: Endoscopy;  Laterality: N/A;   POLYPECTOMY N/A 04/29/2018   Procedure: POLYPECTOMY;  Surgeon: Jinny Carmine, MD;  Location: [facility];  Service: Endoscopy;  Laterality: N/A;    Prior to Admission medications  Medication Sig Start Date End Date Taking? Authorizing Provider  aspirin 81 MG tablet Take 1 tablet by mouth daily.   Yes [provider]  enalapril  (VASOTEC ) 10 MG tablet Take 1 tablet (10 mg total) by mouth daily. 04/06/24  Yes Justus Leita DEL, MD  ibuprofen  (ADVIL ) 600 MG tablet Take 1 tablet (600 mg total) by mouth every 6 (six) hours as needed. 02/23/24  Yes Van Knee, MD  meloxicam  (MOBIC ) 15 MG tablet TAKE 1 TABLET BY MOUTH DAILY 01/05/24  Yes Berglund, Laura H, MD  metFORMIN  (GLUCOPHAGE -XR) 500 MG 24 hr tablet Take 1 tablet (500 mg total) by mouth daily with breakfast. 04/06/24  Yes Justus Leita DEL, MD  pantoprazole  (PROTONIX ) 40 MG tablet TAKE 1 TABLET BY MOUTH EVERY DAY 06/20/24  Yes Justus Leita DEL, MD  rosuvastatin  (CRESTOR ) 10 MG tablet  Take 1 tablet (10 mg total) by mouth daily. 04/06/24  Yes Justus Leita DEL, MD  Semaglutide , 1 MG/DOSE, (OZEMPIC , 1 MG/DOSE,) 4 MG/3ML SOPN Inject 1 mg into the skin once a week. 04/06/24  Yes Justus Leita DEL, MD  spironolactone  (ALDACTONE ) 25 MG tablet Take 1 tablet (25 mg total) by mouth daily. 04/06/24  Yes Justus Leita DEL, MD  zinc gluconate 50 MG tablet Take 50 mg by mouth daily.   Yes [provider]  VITAMIN D PO Take 1 tablet by mouth daily. Patient not taking: Reported on 07/21/2024    [provider]    Allergies as of 06/14/2024   (No Known Allergies)    Family History  Problem Relation Age of Onset   Hypertension Mother    Uterine cancer Mother    CAD Brother    Breast cancer Cousin        mat cousin    Social History   Socioeconomic History   Marital status: Single    Spouse name: Not on file   Number of children: Not on file   Years of education: Not on file   Highest education level: Not on file  Occupational History   Not on file  Tobacco Use   Smoking status: Never   Smokeless tobacco: Never  Vaping Use   Vaping status: Never Used  Substance and Sexual Activity   Alcohol use: Yes    Comment: socially  Drug use: No   Sexual activity: Yes  Other Topics Concern   Not on file  Social History Narrative   Not on file   Social Drivers of Health   Tobacco Use: Low Risk (07/21/2024)   Patient History    Smoking Tobacco Use: Never    Smokeless Tobacco Use: Never    Passive Exposure: Not on file  Financial Resource Strain: Low Risk (04/29/2023)   Overall Financial Resource Strain (CARDIA)    Difficulty of Paying Living Expenses: Not hard at all  Food Insecurity: No Food Insecurity (05/25/2024)   Epic    Worried About Programme Researcher, Broadcasting/film/video in the Last Year: Never true    Ran Out of Food in the Last Year: Never true  Transportation Needs: No Transportation Needs (05/25/2024)   Epic    Lack of Transportation (Medical): No    Lack of  Transportation (Non-Medical): No  Physical Activity: Sufficiently Active (05/25/2024)   Exercise Vital Sign    Days of Exercise per Week: 2 days    Minutes of Exercise per Session: 90 min  Stress: No Stress Concern Present (05/25/2024)   [employer] of Occupational Health - Occupational Stress Questionnaire    Feeling of Stress: Not at all  Social Connections: Unknown (05/25/2024)   Social Connection and Isolation Panel    Frequency of Communication with Friends and Family: More than three times a week    Frequency of Social Gatherings with Friends and Family: More than three times a week    Attends Religious Services: More than 4 times per year    Active Member of [employer] or Organizations: No    Attends Banker Meetings: Never    Marital Status: Not on file  Intimate Partner Violence: Not At Risk (05/25/2024)   Epic    Fear of Current or Ex-Partner: No    Emotionally Abused: No    Physically Abused: No    Sexually Abused: No  Depression (PHQ2-9): Low Risk (05/25/2024)   Depression (PHQ2-9)    PHQ-2 Score: 0  Alcohol Screen: Low Risk (04/29/2023)   Alcohol Screen    Last Alcohol Screening Score (AUDIT): 1  Housing: Unknown (05/25/2024)   Epic    Unable to Pay for Housing in the Last Year: No    Number of Times Moved in the Last Year: Not on file    Homeless in the Last Year: No  Utilities: Not At Risk (05/25/2024)   Epic    Threatened with loss of utilities: No  Health Literacy: Adequate Health Literacy (05/25/2024)   B1300 Health Literacy    Frequency of need for help with medical instructions: Never    Review of Systems: See HPI, otherwise negative ROS  Physical Exam: Ht 5' 4 (1.626 m)   Wt 86.2 kg   BMI 32.61 kg/m  CONSTITUTIONAL: Well-appearing in no acute distress.  HEENT: Pupils equal, round, Extraocular movements intact. Conjunctivae clear NECK: Neck supple CARDIOVASCULAR: Regular rate, no LE edema  RESPIRATORY: No labored breathing   ABDOMEN: Abdomen soft, nontender, not distended, no guarding, no rigidity SKIN: No apparent skin rashes or lesions. NEUROLOGIC: Normal speech, no focal findings. Mental status alert and oriented x4. PSYCHIATRIC: Mood and affect normal.   Impression/Plan: Molly Kelly is here for an EGD to eval abnormal Ba swallow and colonoscopy to be performed for as evaluation of an AP in the transverse in 2019.  Risks, benefits, limitations, and alternatives regarding  colonoscopy have been reviewed with the patient.  Questions have been answered.  All parties agreeable.   MELANY Molly HERO, MD  07/27/2024, 8:02 AM  "

## 2024-07-29 ENCOUNTER — Ambulatory Visit: Payer: Self-pay | Admitting: Gastroenterology

## 2024-07-29 LAB — SURGICAL PATHOLOGY

## 2024-08-10 ENCOUNTER — Encounter: Admitting: Physician Assistant

## 2025-06-08 ENCOUNTER — Ambulatory Visit
